# Patient Record
Sex: Female | Born: 1970
Health system: Southern US, Community
[De-identification: ages and names within clinical notes are randomized; demographics above are authoritative.]

## PROBLEM LIST (undated history)

## (undated) DIAGNOSIS — K219 Gastro-esophageal reflux disease without esophagitis: Secondary | ICD-10-CM

## (undated) HISTORY — DX: Gastro-esophageal reflux disease without esophagitis: K21.9

## (undated) HISTORY — PX: ABDOMINAL HYSTERECTOMY: SHX81

---

## 2005-02-04 ENCOUNTER — Ambulatory Visit: Payer: Self-pay | Admitting: Family Medicine

## 2006-06-30 ENCOUNTER — Inpatient Hospital Stay: Payer: Self-pay | Admitting: Obstetrics and Gynecology

## 2007-06-02 ENCOUNTER — Ambulatory Visit: Payer: Self-pay | Admitting: Family Medicine

## 2008-10-13 ENCOUNTER — Ambulatory Visit: Payer: Self-pay | Admitting: Family Medicine

## 2009-11-30 ENCOUNTER — Ambulatory Visit: Payer: Self-pay | Admitting: Family Medicine

## 2009-12-18 ENCOUNTER — Ambulatory Visit: Payer: Self-pay | Admitting: Family Medicine

## 2010-10-20 ENCOUNTER — Ambulatory Visit: Payer: Self-pay | Admitting: Internal Medicine

## 2010-12-13 ENCOUNTER — Ambulatory Visit: Payer: Self-pay | Admitting: Family Medicine

## 2010-12-25 ENCOUNTER — Ambulatory Visit: Payer: Self-pay | Admitting: Family Medicine

## 2011-12-30 ENCOUNTER — Ambulatory Visit: Payer: Self-pay | Admitting: Family Medicine

## 2012-05-28 ENCOUNTER — Ambulatory Visit: Payer: Self-pay | Admitting: Internal Medicine

## 2012-08-21 ENCOUNTER — Ambulatory Visit: Payer: Self-pay | Admitting: Family Medicine

## 2012-12-17 ENCOUNTER — Ambulatory Visit: Payer: Self-pay | Admitting: Family Medicine

## 2013-01-04 ENCOUNTER — Ambulatory Visit: Payer: Self-pay | Admitting: Unknown Physician Specialty

## 2013-02-10 ENCOUNTER — Ambulatory Visit: Payer: Self-pay | Admitting: Family Medicine

## 2013-02-17 ENCOUNTER — Ambulatory Visit: Payer: Self-pay | Admitting: Family Medicine

## 2014-03-18 ENCOUNTER — Ambulatory Visit: Payer: Self-pay | Admitting: Family Medicine

## 2014-03-21 ENCOUNTER — Ambulatory Visit: Payer: Self-pay | Admitting: Family Medicine

## 2014-05-26 ENCOUNTER — Ambulatory Visit: Payer: Self-pay | Admitting: Physician Assistant

## 2014-05-26 LAB — CBC WITH DIFFERENTIAL/PLATELET
BASOS ABS: 0 10*3/uL (ref 0.0–0.1)
Basophil %: 0.3 %
Eosinophil #: 0.1 10*3/uL (ref 0.0–0.7)
Eosinophil %: 2.1 %
HCT: 45.5 % (ref 35.0–47.0)
HGB: 15 g/dL (ref 12.0–16.0)
LYMPHS ABS: 2.3 10*3/uL (ref 1.0–3.6)
Lymphocyte %: 31.2 %
MCH: 32.1 pg (ref 26.0–34.0)
MCHC: 33 g/dL (ref 32.0–36.0)
MCV: 97 fL (ref 80–100)
Monocyte #: 0.6 x10 3/mm (ref 0.2–0.9)
Monocyte %: 8.9 %
NEUTROS ABS: 4.2 10*3/uL (ref 1.4–6.5)
Neutrophil %: 57.5 %
PLATELETS: 287 10*3/uL (ref 150–440)
RBC: 4.68 10*6/uL (ref 3.80–5.20)
RDW: 13.2 % (ref 11.5–14.5)
WBC: 7.2 10*3/uL (ref 3.6–11.0)

## 2014-05-26 LAB — BASIC METABOLIC PANEL
Anion Gap: 11 (ref 7–16)
BUN: 11 mg/dL (ref 7–18)
Calcium, Total: 9 mg/dL (ref 8.5–10.1)
Chloride: 102 mmol/L (ref 98–107)
Co2: 28 mmol/L (ref 21–32)
Creatinine: 0.78 mg/dL (ref 0.60–1.30)
EGFR (African American): >60
EGFR (Non-African Amer.): >60
Glucose: 95 mg/dL (ref 65–99)
OSMOLALITY: 280 (ref 275–301)
Potassium: 3.8 mmol/L (ref 3.5–5.1)
Sodium: 141 mmol/L (ref 136–145)

## 2014-05-26 LAB — D-DIMER(ARMC): D-Dimer: 134 ng/ml

## 2014-07-05 IMAGING — CR DG LUMBAR SPINE COMPLETE 4+V
1 series · 5 of 5 positions shown · non-contrast
Comparison: none

REASON FOR EXAM: lower back pain
COMMENTS:

[Series 1: ap · 0.17mm/px · 5 of 5 slices shown]
[im 1/5]
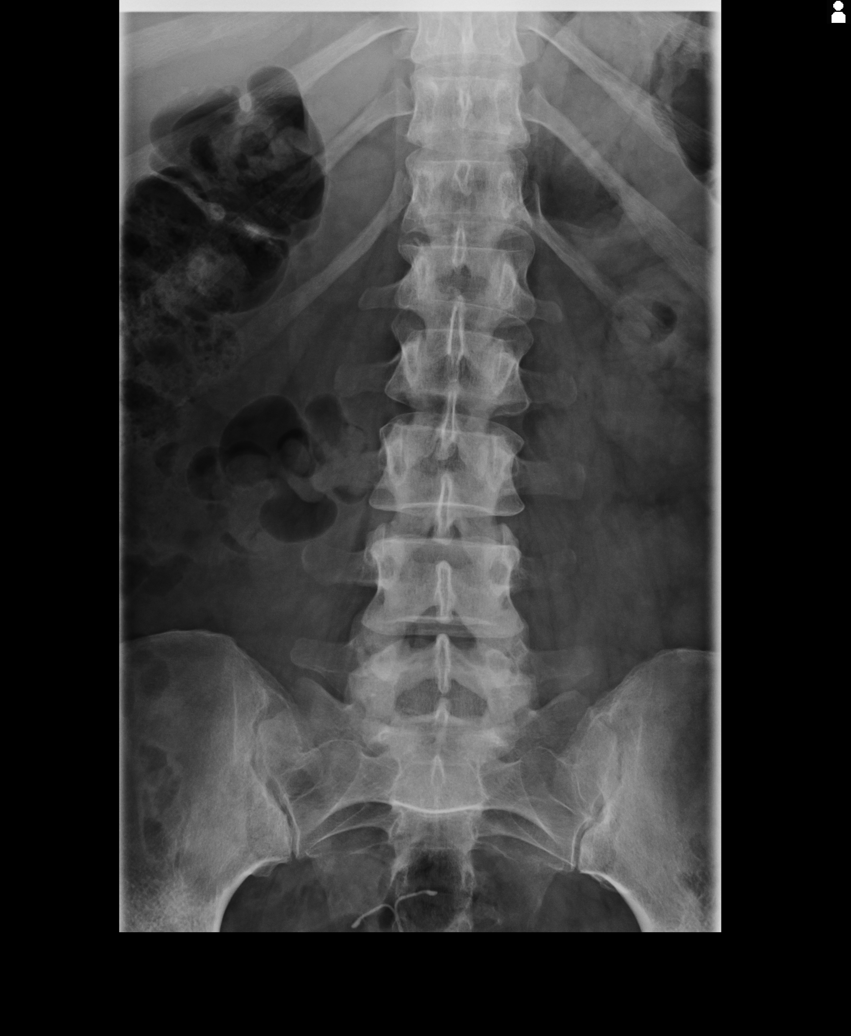
[im 2/5]
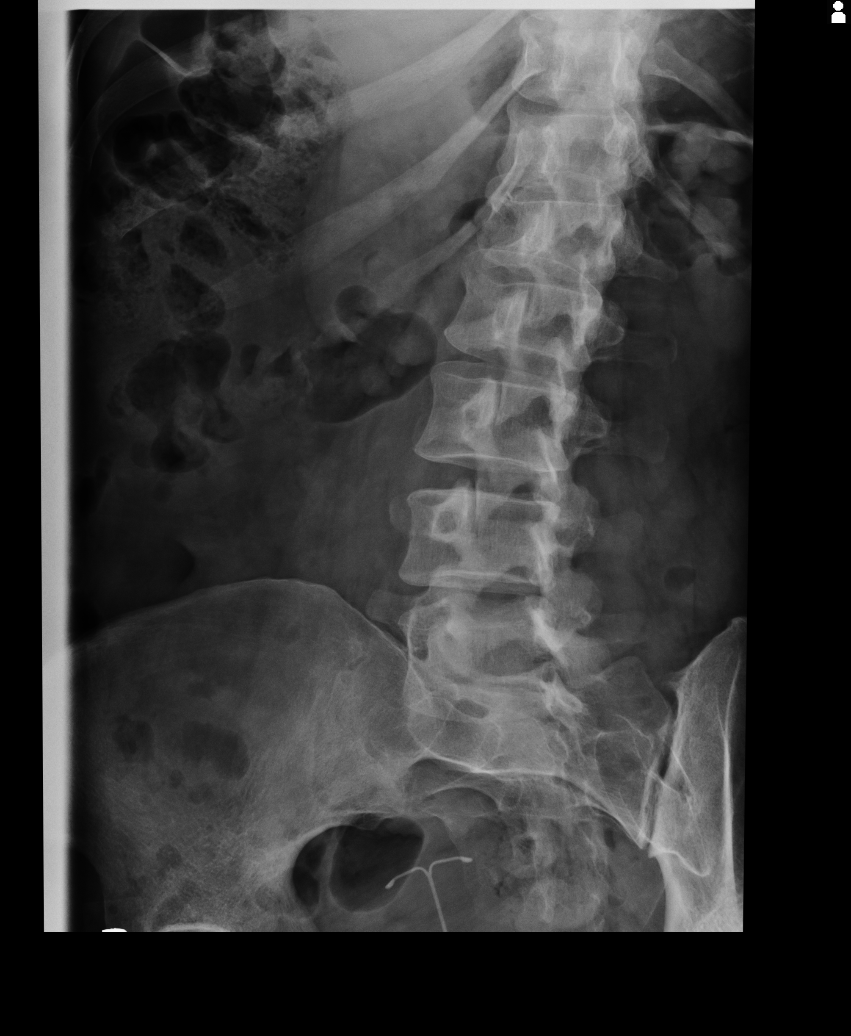
[im 3/5]
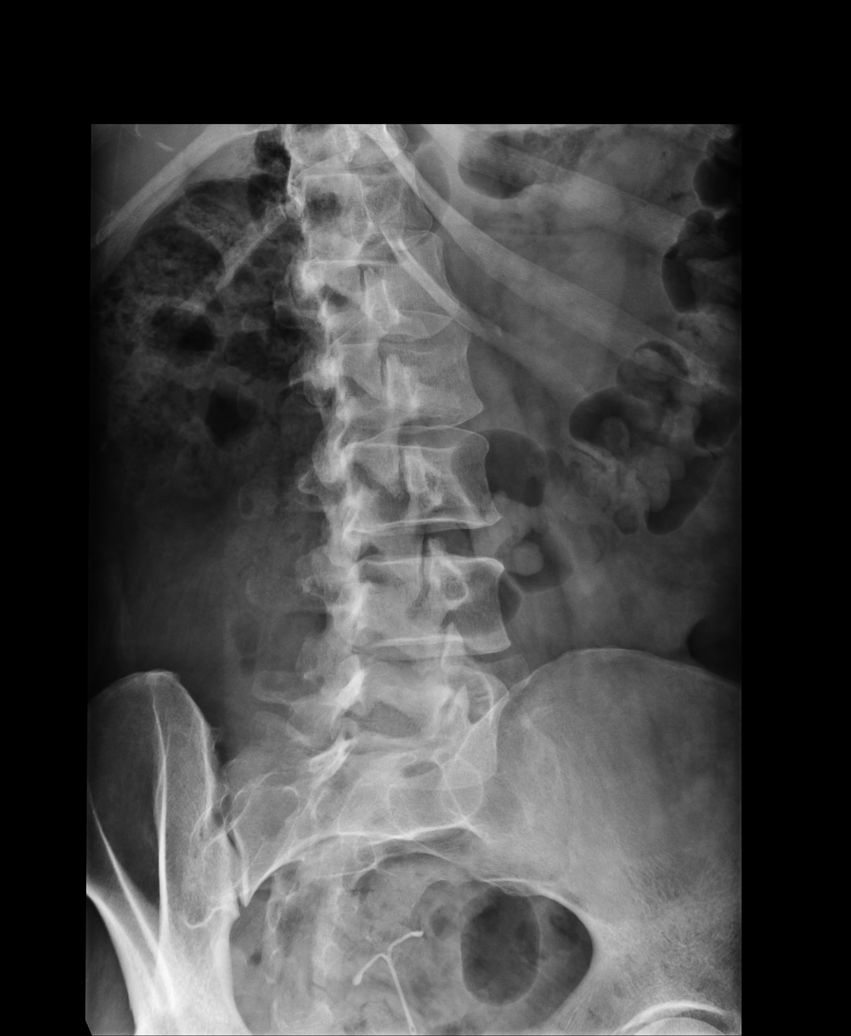
[im 4/5]
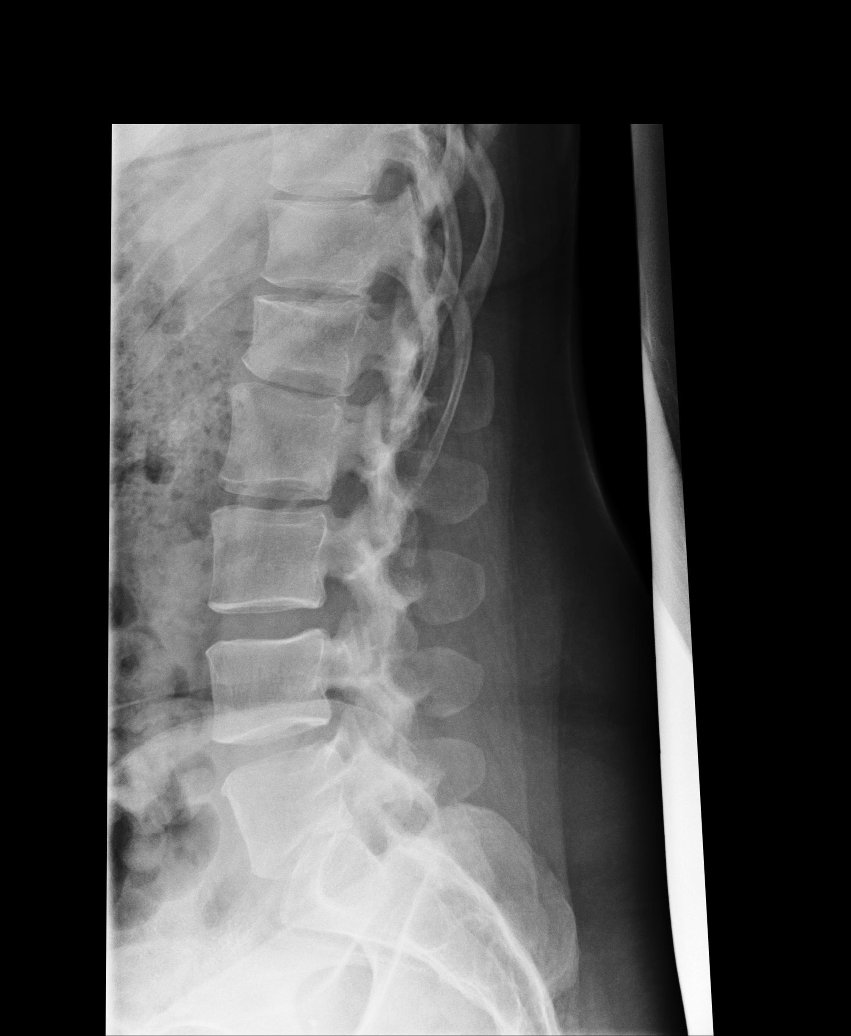
[im 5/5]
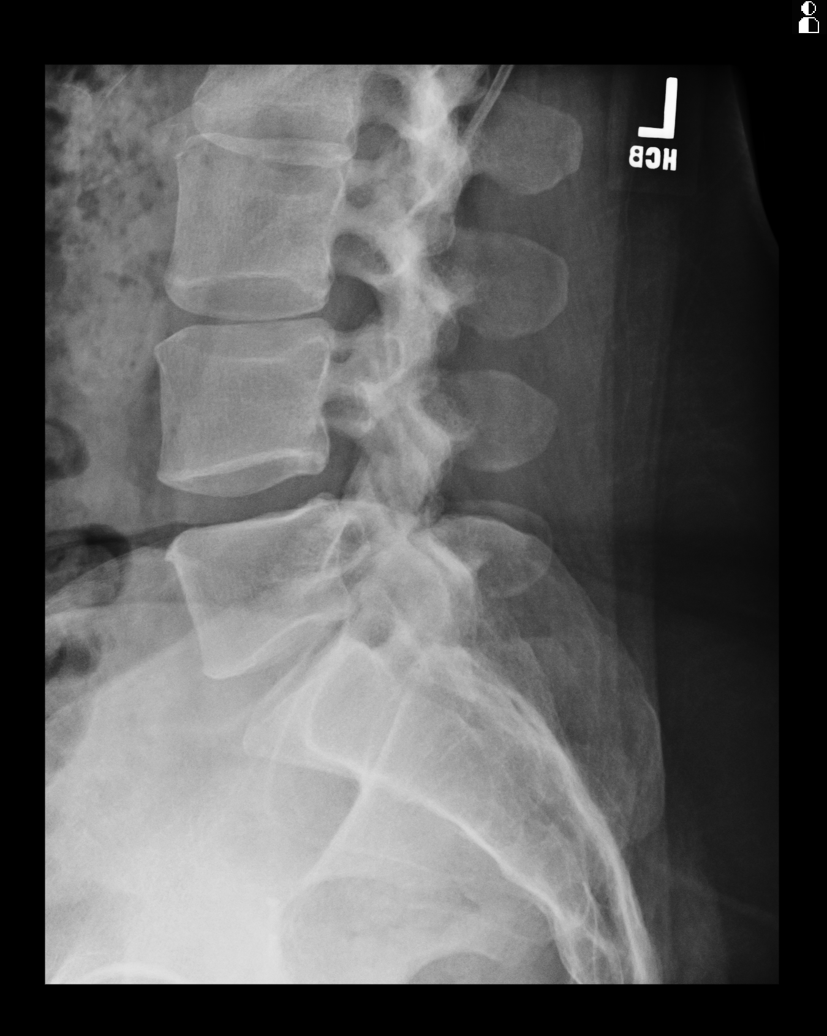

[5 of 5 positions shown; findings below may reference images not displayed]

PROCEDURE:     MDR - M[REDACTED] SPINE WITH OBLIQUES  - December 17, 2012  [DATE]

RESULT:     The lumbar vertebral bodies are preserved in height with the
exception of L1 where there is  very mild anterior wedging. Subtle
relatively acute angulation is seen along the anterior/inferior cortex of
the L1 vertebral body. The intervertebral disc space heights are reasonably
well maintained. There is no pars defect or spondylolisthesis. The pedicles
and transverse processes appear intact. An IUD is present.
IMPRESSION: There is mild wedging of the body of L1 which may be acute
or chronic. Otherwise the examination is within the limits of normal.

[REDACTED]

## 2015-03-03 ENCOUNTER — Encounter: Payer: Self-pay | Admitting: Family Medicine

## 2015-03-03 ENCOUNTER — Ambulatory Visit (INDEPENDENT_AMBULATORY_CARE_PROVIDER_SITE_OTHER): Payer: BLUE CROSS/BLUE SHIELD | Admitting: Family Medicine

## 2015-03-03 VITALS — BP 120/82 | HR 70 | Ht 70.0 in | Wt 215.0 lb

## 2015-03-03 DIAGNOSIS — E663 Overweight: Secondary | ICD-10-CM

## 2015-03-03 DIAGNOSIS — Z0000 Encounter for general adult medical examination without abnormal findings: Secondary | ICD-10-CM

## 2015-03-03 DIAGNOSIS — E0500 Thyrotoxicosis with diffuse goiter without thyrotoxic crisis or storm: Secondary | ICD-10-CM

## 2015-03-03 DIAGNOSIS — F419 Anxiety disorder, unspecified: Secondary | ICD-10-CM

## 2015-03-03 LAB — HEMOCCULT GUIAC POC 1CARD (OFFICE): FECAL OCCULT BLD: NEGATIVE

## 2015-03-03 MED ORDER — ALPRAZOLAM 0.25 MG PO TABS: 0.2500 mg | ORAL_TABLET | Freq: Two times a day (BID) | ORAL | Status: DC | PRN

## 2015-03-03 NOTE — Progress Notes (Signed)
Name: Colleen Bartlett   MRN: [id number]    DOB: 1971/06/07   Date:03/03/2015       Progress Note  Subjective  Chief Complaint  Chief Complaint  Patient presents with  . Annual Exam    pap  . Anxiety    flying to [place]- wants something for trip    Anxiety Presents for follow-up visit. Onset was more than 5 years ago. The problem has been waxing and waning. Patient reports no chest pain, compulsions, confusion, decreased concentration, depressed mood, dizziness, dry mouth, excessive worry, feeling of choking, hyperventilation, impotence, insomnia, irritability, malaise, muscle tension, nausea, nervous/anxious behavior, obsessions, palpitations, panic, restlessness, shortness of breath or suicidal ideas. Symptoms occur occasionally. The severity of symptoms is moderate. The symptoms are aggravated by specific phobias (air travel). The quality of sleep is good.   There are no known risk factors. There is no history of anemia, anxiety/panic attacks, arrhythmia, asthma, bipolar disorder, CAD, CHF, chronic lung disease, depression, fibromyalgia, hyperthyroidism or suicide attempts. Past treatments include benzodiazephines. The treatment provided significant relief. Compliance with prior treatments has been good.    No problem-specific assessment & plan notes found for this encounter.   Past Medical History  Diagnosis Date  . GERD (gastroesophageal reflux disease)     Past Surgical History  Procedure Laterality Date  . Cesarean section      Family History  Problem Relation Age of Onset  . Cancer Mother   . Diabetes Father   . Cancer Maternal Grandmother   . Cancer Maternal Grandfather   . Cancer Paternal Grandmother   . Diabetes Paternal Grandmother   . Cancer Paternal Grandfather     Social History   Social History  . Marital Status: Married    Spouse Name: N/A  . Number of Children: N/A  . Years of Education: N/A   Occupational History  . Not on file.   Social  History Main Topics  . Smoking status: Former Games developer  . Smokeless tobacco: Not on file  . Alcohol Use: 0.0 oz/week    0 Standard drinks or equivalent per week  . Drug Use: No  . Sexual Activity: Yes   Other Topics Concern  . Not on file   Social History Narrative  . No narrative on file    No Known Allergies   Review of Systems  Constitutional: Negative for fever, chills, weight loss, malaise/fatigue and irritability.  HENT: Negative for ear discharge, ear pain and sore throat.   Eyes: Negative for blurred vision.  Respiratory: Negative for cough, sputum production, shortness of breath and wheezing.   Cardiovascular: Negative for chest pain, palpitations and leg swelling.  Gastrointestinal: Negative for heartburn, nausea, abdominal pain, diarrhea, constipation, blood in stool and melena.  Genitourinary: Negative for dysuria, urgency, frequency, hematuria and impotence.  Musculoskeletal: Negative for myalgias, back pain, joint pain and neck pain.  Skin: Negative for rash.  Neurological: Negative for dizziness, tingling, sensory change, focal weakness and headaches.  Endo/Heme/Allergies: Negative for environmental allergies and polydipsia. Does not bruise/bleed easily.  Psychiatric/Behavioral: Negative for depression, suicidal ideas, confusion and decreased concentration. The patient is not nervous/anxious and does not have insomnia.      Objective  Filed Vitals:   03/03/15 0951  BP: 120/82  Pulse: 70  Height:  (1.778 m)  Weight: 215 lb (97.523 kg)    Physical Exam  Constitutional: She is well-developed, well-nourished, and in no distress. No distress.  HENT:  Head: Normocephalic and atraumatic.  Right Ear:  External ear normal.  Left Ear: External ear normal.  Nose: Nose normal.  Mouth/Throat: Oropharynx is clear and moist.  Eyes: Conjunctivae and EOM are normal. Pupils are equal, round, and reactive to light. Right eye exhibits no discharge. Left eye exhibits  no discharge.  Neck: Normal range of motion. Neck supple. No JVD present. No thyromegaly present.  Cardiovascular: Normal rate, regular rhythm, normal heart sounds and intact distal pulses.  Exam reveals no gallop and no friction rub.   No murmur heard. Pulmonary/Chest: Effort normal and breath sounds normal.  Abdominal: Soft. Bowel sounds are normal. She exhibits no mass. There is no tenderness. There is no guarding.  Musculoskeletal: Normal range of motion. She exhibits no edema.  Lymphadenopathy:    She has no cervical adenopathy.  Neurological: She is alert. She has normal reflexes.  Skin: Skin is warm and dry. She is not diaphoretic.  Psychiatric: Mood and affect normal.  Nursing note and vitals reviewed.     Assessment & Plan  Problem List Items Addressed This Visit    None    Visit Diagnoses    Annual physical exam    -  Primary    Relevant Orders    Renal Function Panel    Lipid Profile    POCT Occult Blood Stool (Completed)    MM Digital Screening    Pap IG (Image Guided)    Acute anxiety        flying anxiety/germaphobia    Relevant Medications    ALPRAZolam (XANAX) 0.25 MG tablet    Overweight with body mass index (BMI) 25.0-29.9        Graves disease        Relevant Orders    Thyroid Panel With TSH         Dr. [facility]den [facility]  03/03/2015

## 2015-03-03 NOTE — Patient Instructions (Signed)

## 2015-03-04 LAB — RENAL FUNCTION PANEL
Albumin: 4.2 g/dL (ref 3.5–5.5)
BUN/Creatinine Ratio: 18 (ref 9–23)
BUN: 11 mg/dL (ref 6–24)
CALCIUM: 8.9 mg/dL (ref 8.7–10.2)
CO2: 23 mmol/L (ref 18–29)
CREATININE: 0.62 mg/dL (ref 0.57–1.00)
Chloride: 100 mmol/L (ref 97–108)
GFR calc Af Amer: 128 mL/min/{1.73_m2} (ref >59)
GFR calc non Af Amer: 111 mL/min/{1.73_m2} (ref >59)
Glucose: 67 mg/dL (ref 65–99)
PHOSPHORUS: 3 mg/dL (ref 2.5–4.5)
Potassium: 4.3 mmol/L (ref 3.5–5.2)
Sodium: 138 mmol/L (ref 134–144)

## 2015-03-04 LAB — LIPID PANEL
CHOLESTEROL TOTAL: 200 mg/dL — AB (ref 100–199)
Chol/HDL Ratio: 4.2 ratio units (ref 0.0–4.4)
HDL: 48 mg/dL (ref >39)
LDL Calculated: 117 mg/dL — ABNORMAL HIGH (ref 0–99)
TRIGLYCERIDES: 175 mg/dL — AB (ref 0–149)
VLDL CHOLESTEROL CAL: 35 mg/dL (ref 5–40)

## 2015-03-04 LAB — THYROID PANEL WITH TSH
FREE THYROXINE INDEX: 2 (ref 1.2–4.9)
T3 Uptake Ratio: 27 % (ref 24–39)
T4, Total: 7.3 ug/dL (ref 4.5–12.0)
TSH: 1.87 u[IU]/mL (ref 0.450–4.500)

## 2015-03-10 LAB — PAP IG (IMAGE GUIDED): PAP SMEAR COMMENT: 0

## 2015-03-21 ENCOUNTER — Ambulatory Visit: Payer: BLUE CROSS/BLUE SHIELD | Attending: Family Medicine

## 2015-03-22 ENCOUNTER — Encounter: Payer: Self-pay | Admitting: Family Medicine

## 2015-03-22 ENCOUNTER — Ambulatory Visit (INDEPENDENT_AMBULATORY_CARE_PROVIDER_SITE_OTHER): Payer: BLUE CROSS/BLUE SHIELD | Admitting: Family Medicine

## 2015-03-22 VITALS — BP 120/80 | HR 68 | Ht 70.0 in | Wt 212.0 lb

## 2015-03-22 DIAGNOSIS — J219 Acute bronchiolitis, unspecified: Secondary | ICD-10-CM | POA: Diagnosis not present

## 2015-03-22 DIAGNOSIS — J0100 Acute maxillary sinusitis, unspecified: Secondary | ICD-10-CM | POA: Diagnosis not present

## 2015-03-22 MED ORDER — AMOXICILLIN 500 MG PO CAPS: 500.0000 mg | ORAL_CAPSULE | Freq: Three times a day (TID) | ORAL | Status: DC

## 2015-03-22 MED ORDER — GUAIFENESIN-CODEINE 100-10 MG/5ML PO SOLN
5.0000 mL | Freq: Three times a day (TID) | ORAL | Status: DC | PRN
Start: 2015-03-22 — End: 2015-08-22

## 2015-03-22 NOTE — Progress Notes (Signed)
Name: Colleen Bartlett   MRN: [id number]    DOB: Apr 21, 1971   Date:03/22/2015       Progress Note  Subjective  Chief Complaint  Chief Complaint  Patient presents with  . Sinusitis    productive cough- thick    Sinusitis This is a new problem. The current episode started in the past 7 days. The problem has been gradually worsening since onset. The maximum temperature recorded prior to her arrival was 100.4 - 100.9 F. The fever has been present for 3 to 4 days. Associated symptoms include chills, congestion, coughing, diaphoresis, ear pain, headaches, a hoarse voice, neck pain, sinus pressure, sneezing, a sore throat and swollen glands. Pertinent negatives include no shortness of breath. Past treatments include acetaminophen and oral decongestants. The treatment provided no relief.  Cough This is a new problem. The current episode started in the past 7 days. The problem has been gradually worsening. The cough is non-productive. Associated symptoms include chills, ear pain, a fever, headaches, heartburn, nasal congestion, postnasal drip and a sore throat. Pertinent negatives include no chest pain, ear congestion or shortness of breath. The symptoms are aggravated by pollens. Treatments tried: mucolytic agent. There is no history of asthma, bronchitis, emphysema or pneumonia.    No problem-specific assessment & plan notes found for this encounter.   Past Medical History  Diagnosis Date  . GERD (gastroesophageal reflux disease)     Past Surgical History  Procedure Laterality Date  . Cesarean section      Family History  Problem Relation Age of Onset  . Cancer Mother   . Diabetes Father   . Cancer Maternal Grandmother   . Cancer Maternal Grandfather   . Cancer Paternal Grandmother   . Diabetes Paternal Grandmother   . Cancer Paternal Grandfather     Social History   Social History  . Marital Status: Married    Spouse Name: N/A  . Number of Children: N/A  . Years of  Education: N/A   Occupational History  . Not on file.   Social History Main Topics  . Smoking status: Former Games developer  . Smokeless tobacco: Not on file  . Alcohol Use: 0.0 oz/week    0 Standard drinks or equivalent per week  . Drug Use: No  . Sexual Activity: Yes   Other Topics Concern  . Not on file   Social History Narrative    No Known Allergies   Review of Systems  Constitutional: Positive for fever, chills and diaphoresis.  HENT: Positive for congestion, ear pain, hoarse voice, postnasal drip, sinus pressure, sneezing and sore throat.   Respiratory: Positive for cough. Negative for shortness of breath.   Cardiovascular: Negative for chest pain.  Gastrointestinal: Positive for heartburn.       Intermitant zantac  Musculoskeletal: Positive for neck pain.  Neurological: Positive for headaches.     Objective  Filed Vitals:   03/22/15 1008  BP: 120/80  Pulse: 68  Height:  (1.778 m)  Weight: 212 lb (96.163 kg)    Physical Exam  Constitutional: She is well-developed, well-nourished, and in no distress. No distress.  HENT:  Head: Normocephalic and atraumatic.  Right Ear: External ear normal.  Left Ear: External ear normal.  Nose: Nose normal.  Mouth/Throat: Oropharynx is clear and moist.  Eyes: Conjunctivae and EOM are normal. Pupils are equal, round, and reactive to light. Right eye exhibits no discharge. Left eye exhibits no discharge.  Neck: Normal range of motion. Neck supple. No JVD  present. No thyromegaly present.  Cardiovascular: Normal rate, regular rhythm, normal heart sounds and intact distal pulses.  Exam reveals no gallop and no friction rub.   No murmur heard. Pulmonary/Chest: Effort normal and breath sounds normal.  Abdominal: Soft. Bowel sounds are normal. She exhibits no mass. There is no tenderness. There is no guarding.  Musculoskeletal: Normal range of motion. She exhibits no edema.  Lymphadenopathy:    She has no cervical adenopathy.   Neurological: She is alert. She has normal reflexes.  Skin: Skin is warm and dry. She is not diaphoretic.  Psychiatric: Mood and affect normal.      Assessment & Plan  Problem List Items Addressed This Visit    None    Visit Diagnoses    Acute maxillary sinusitis, recurrence not specified    -  Primary    Relevant Medications    amoxicillin (AMOXIL) 500 MG capsule    guaiFENesin-codeine 100-10 MG/5ML syrup    Bronchiolitis        Relevant Medications    amoxicillin (AMOXIL) 500 MG capsule    guaiFENesin-codeine 100-10 MG/5ML syrup         Dr. [facility]  03/22/2015

## 2015-08-22 ENCOUNTER — Ambulatory Visit (INDEPENDENT_AMBULATORY_CARE_PROVIDER_SITE_OTHER): Payer: BLUE CROSS/BLUE SHIELD | Admitting: Family Medicine

## 2015-08-22 ENCOUNTER — Encounter: Payer: Self-pay | Admitting: Family Medicine

## 2015-08-22 VITALS — BP 110/80 | HR 78 | Ht 70.0 in | Wt 218.0 lb

## 2015-08-22 DIAGNOSIS — J0100 Acute maxillary sinusitis, unspecified: Secondary | ICD-10-CM | POA: Diagnosis not present

## 2015-08-22 MED ORDER — AMOXICILLIN-POT CLAVULANATE 875-125 MG PO TABS: 1.0000 | ORAL_TABLET | Freq: Two times a day (BID) | ORAL | Status: DC

## 2015-08-22 MED ORDER — FLUTICASONE PROPIONATE 50 MCG/ACT NA SUSP: 2.0000 | Freq: Every day | NASAL | Status: DC

## 2015-08-22 NOTE — Patient Instructions (Signed)
Sinus Headache A sinus headache occurs when the paranasal sinuses become clogged or swollen. Paranasal sinuses are air pockets within the bones of the face. Sinus headaches can range from mild to severe. CAUSES A sinus headache can result from various conditions that affect the sinuses, such as:  Colds.  Sinus infections.  Allergies. SYMPTOMS The main symptom of this condition is a headache that may feel like pain or pressure in the face, forehead, ears, or upper teeth. People who have a sinus headache often have other symptoms, such as:  Congested or runny nose.  Fever.  Inability to smell. Weather changes can make symptoms worse. DIAGNOSIS This condition may be diagnosed based on:  A physical exam and medical history.  Imaging tests, such as a CT scan and MRI, to check for problems with the sinuses.  A specialist may look into the sinuses with a tool that has a camera (endoscopy). TREATMENT Treatment for this condition depends on the cause.  Sinus pain that is caused by a sinus infection may be treated with antibiotic medicine.  Sinus pain that is caused by allergies may be helped by allergy medicines (antihistamines) and medicated nasal sprays.  Sinus pain that is caused by congestion may be helped by flushing the nose and sinuses with saline solution. HOME CARE INSTRUCTIONS  Take medicines only as directed by your health care provider.  If you were prescribed an antibiotic medicine, finish all of it even if you start to feel better.  If you have congestion, use a nasal spray to help reduce pressure.  If directed, apply a warm, moist washcloth to your face to help relieve pain. SEEK MEDICAL CARE IF:  You have headaches more than one time each week.  You have sensitivity to light or sound.  You have a fever.  You feel sick to your stomach (nauseous) or you throw up (vomit).  Your headaches do not get better with treatment. Many people think that they have a  sinus headache when they actually have migraines or tension headaches. SEEK IMMEDIATE MEDICAL CARE IF:  You have vision problems.  You have sudden, severe pain in your face or head.  You have a seizure.  You are confused.  You have a stiff neck.   This information is not intended to replace advice given to you by your health care provider. Make sure you discuss any questions you have with your health care provider.   Document Released: 07/18/2004 Document Revised: 10/25/2014 Document Reviewed: 06/06/2014 Elsevier Interactive Patient Education 2016 [employer]  

## 2015-08-22 NOTE — Progress Notes (Signed)
Name: Colleen Bartlett   MRN: [id number]    DOB: April 08, 1971   Date:08/22/2015       Progress Note  Subjective  Chief Complaint  Chief Complaint  Patient presents with  . Headache    started x 4 days ago after ear popped- hurts on L) side into neck    Headache  This is a new problem. The current episode started in the past 7 days. The problem occurs constantly. The problem has been waxing and waning. The pain is located in the left unilateral and retro-orbital region. The pain radiates to the face. The quality of the pain is described as aching. The pain is at a severity of 8/10. The pain is moderate. Associated symptoms include neck pain, sinus pressure and tinnitus. Pertinent negatives include no abdominal pain, back pain, blurred vision, coughing, dizziness, ear pain, eye pain, eye redness, eye watering, fever, hearing loss, insomnia, nausea, numbness, sore throat, tingling, visual change, weakness or weight loss. The symptoms are aggravated by noise. She has tried acetaminophen and NSAIDs for the symptoms. The treatment provided mild relief. Her past medical history is significant for migraine headaches.    No problem-specific assessment & plan notes found for this encounter.   Past Medical History  Diagnosis Date  . GERD (gastroesophageal reflux disease)     Past Surgical History  Procedure Laterality Date  . Cesarean section      Family History  Problem Relation Age of Onset  . Cancer Mother   . Diabetes Father   . Cancer Maternal Grandmother   . Cancer Maternal Grandfather   . Cancer Paternal Grandmother   . Diabetes Paternal Grandmother   . Cancer Paternal Grandfather     Social History   Social History  . Marital Status: Married    Spouse Name: N/A  . Number of Children: N/A  . Years of Education: N/A   Occupational History  . Not on file.   Social History Main Topics  . Smoking status: Former Games developer  . Smokeless tobacco: Not on file  . Alcohol Use: 0.0  oz/week    0 Standard drinks or equivalent per week  . Drug Use: No  . Sexual Activity: Yes   Other Topics Concern  . Not on file   Social History Narrative    No Known Allergies   Review of Systems  Constitutional: Negative for fever, chills, weight loss and malaise/fatigue.  HENT: Positive for sinus pressure and tinnitus. Negative for ear discharge, ear pain, hearing loss and sore throat.   Eyes: Negative for blurred vision, pain and redness.  Respiratory: Negative for cough, sputum production, shortness of breath and wheezing.   Cardiovascular: Negative for chest pain, palpitations and leg swelling.  Gastrointestinal: Negative for heartburn, nausea, abdominal pain, diarrhea, constipation, blood in stool and melena.  Genitourinary: Negative for dysuria, urgency, frequency and hematuria.  Musculoskeletal: Positive for neck pain. Negative for myalgias, back pain and joint pain.  Skin: Negative for rash.  Neurological: Positive for headaches. Negative for dizziness, tingling, sensory change, focal weakness, weakness and numbness.  Endo/Heme/Allergies: Negative for environmental allergies and polydipsia. Does not bruise/bleed easily.  Psychiatric/Behavioral: Negative for depression and suicidal ideas. The patient is not nervous/anxious and does not have insomnia.      Objective  Filed Vitals:   08/22/15 1359  BP: 110/80  Pulse: 78  Height:  (1.778 m)  Weight: 218 lb (98.884 kg)    Physical Exam  Constitutional: She is oriented to person, place,  and time and well-developed, well-nourished, and in no distress. No distress.  HENT:  Head: Normocephalic and atraumatic.  Right Ear: Hearing, tympanic membrane, external ear and ear canal normal. No tenderness. No middle ear effusion.  Left Ear: Hearing, tympanic membrane, external ear and ear canal normal. No tenderness.  No middle ear effusion.  Nose: Nose normal.  Mouth/Throat: Oropharynx is clear and moist.  Eyes:  Conjunctivae and EOM are normal. Pupils are equal, round, and reactive to light. Right eye exhibits no discharge. Left eye exhibits no discharge.  Neck: Normal range of motion. Neck supple. No JVD present. No thyromegaly present.  Cardiovascular: Normal rate, regular rhythm, normal heart sounds and intact distal pulses.  Exam reveals no gallop and no friction rub.   No murmur heard. Pulmonary/Chest: Effort normal and breath sounds normal.  Abdominal: Soft. Bowel sounds are normal. She exhibits no mass. There is no tenderness. There is no guarding.  Musculoskeletal: Normal range of motion. She exhibits no edema.  Lymphadenopathy:    She has no cervical adenopathy.  Neurological: She is alert and oriented to person, place, and time. She has normal motor skills, normal sensation, normal strength, normal reflexes and intact cranial nerves. No cranial nerve deficit. Gait normal.  Skin: Skin is warm and dry. She is not diaphoretic.  Psychiatric: Mood and affect normal.      Assessment & Plan  Problem List Items Addressed This Visit    None    Visit Diagnoses    Acute maxillary sinusitis, recurrence not specified    -  Primary    sudsfed/30mg     Relevant Medications    amoxicillin-clavulanate (AUGMENTIN) 875-125 MG tablet    fluticasone (FLONASE) 50 MCG/ACT nasal spray         Dr. [facility]  08/22/2015

## 2015-12-12 ENCOUNTER — Encounter: Payer: Self-pay | Admitting: Family Medicine

## 2015-12-12 ENCOUNTER — Ambulatory Visit (INDEPENDENT_AMBULATORY_CARE_PROVIDER_SITE_OTHER): Payer: BLUE CROSS/BLUE SHIELD | Admitting: Family Medicine

## 2015-12-12 VITALS — BP 120/80 | HR 80 | Ht 69.0 in | Wt 221.0 lb

## 2015-12-12 DIAGNOSIS — K645 Perianal venous thrombosis: Secondary | ICD-10-CM | POA: Diagnosis not present

## 2015-12-12 IMAGING — CR DG CHEST 2V
2 series · 2 of 2 positions shown · non-contrast
Comparison: Lumbar spine films of 12/17/2012

CLINICAL DATA: Chest pain

EXAM:
CHEST  2 VIEW

[chest pa]
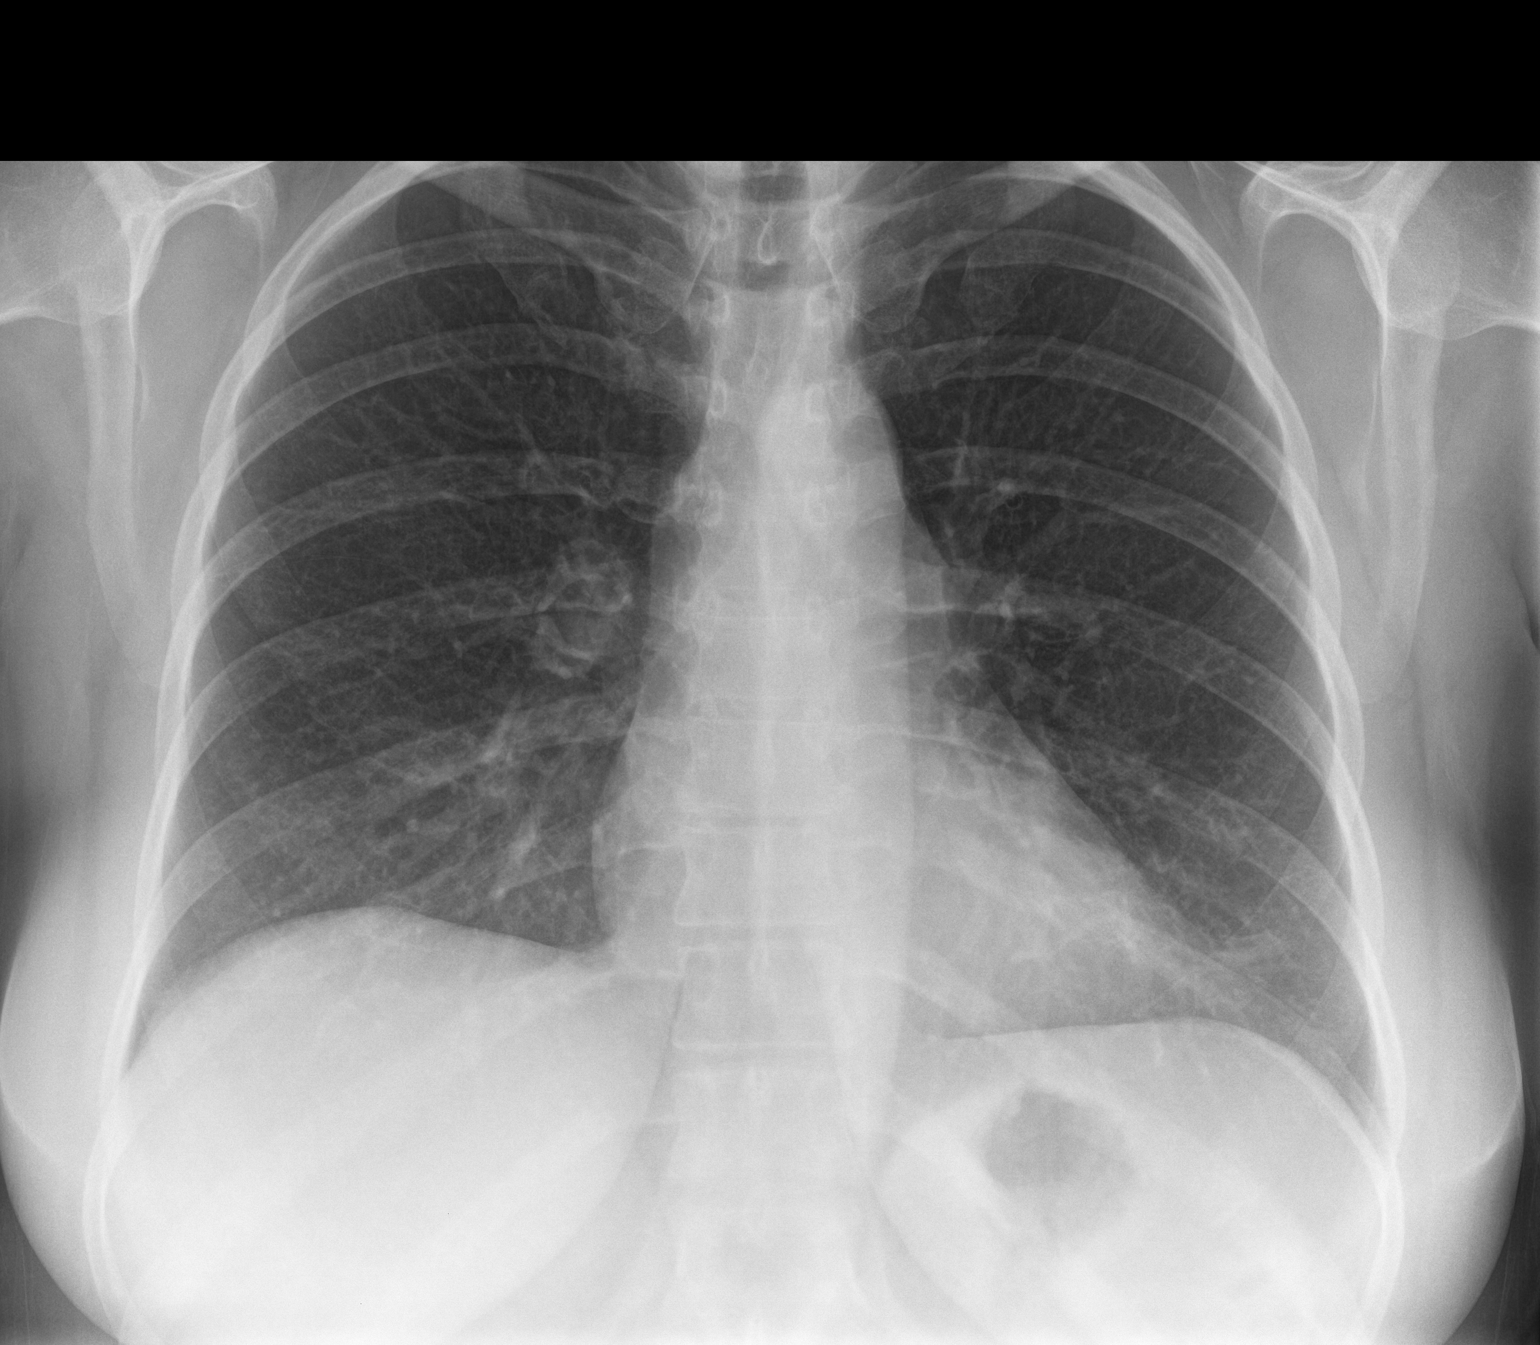

[chest lat]
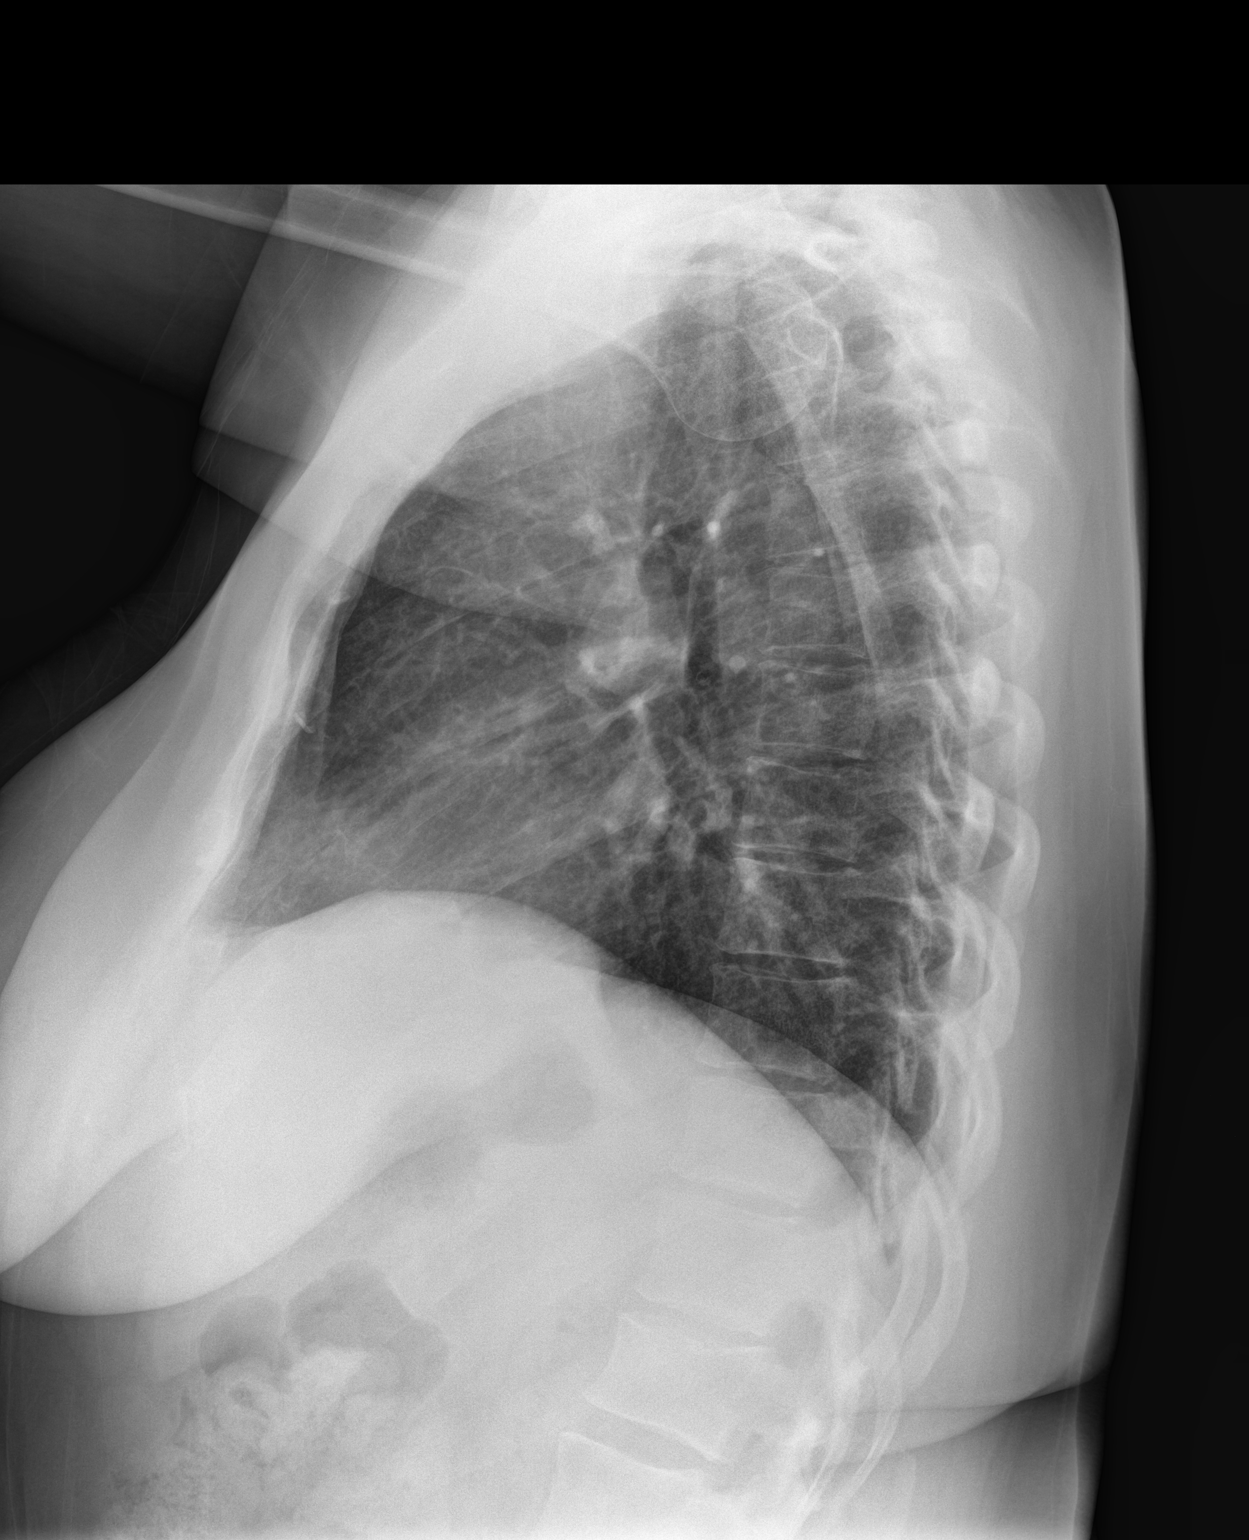

[2 of 2 positions shown; findings below may reference images not displayed]

FINDINGS: No active infiltrate or effusion is seen, with mild linear
atelectasis or scarring at the left lung base. Mediastinal and hilar
contours are unremarkable. The heart is within normal limits in
size. No acute bony abnormality is seen. A partially compressed L1
vertebral body is unchanged compared to lumbar spine films from
12/17/2012.
IMPRESSION: No active lung disease. Mild linear atelectasis or scarring at the
left lung base. Stable anterior wedging of L1.

## 2015-12-12 MED ORDER — HYDROCORTISONE 2.5 % RE CREA: 1.0000 "application " | TOPICAL_CREAM | Freq: Two times a day (BID) | RECTAL | Status: DC

## 2015-12-12 NOTE — Progress Notes (Signed)
Name: Colleen Bartlett   MRN: [id number]    DOB: Dec 26, 1970   Date:12/12/2015       Progress Note  Subjective  Chief Complaint  Chief Complaint  Patient presents with  . Hemorrhoids    started week and half ago- external and bleeding    HPI Comments: Patient presents for a suspected bleeding hemarrhoid.   No problem-specific assessment & plan notes found for this encounter.   Past Medical History  Diagnosis Date  . GERD (gastroesophageal reflux disease)     Past Surgical History  Procedure Laterality Date  . Cesarean section      Family History  Problem Relation Age of Onset  . Cancer Mother   . Diabetes Father   . Cancer Maternal Grandmother   . Cancer Maternal Grandfather   . Cancer Paternal Grandmother   . Diabetes Paternal Grandmother   . Cancer Paternal Grandfather     Social History   Social History  . Marital Status: Married    Spouse Name: N/A  . Number of Children: N/A  . Years of Education: N/A   Occupational History  . Not on file.   Social History Main Topics  . Smoking status: Former Games developer  . Smokeless tobacco: Not on file  . Alcohol Use: 0.0 oz/week    0 Standard drinks or equivalent per week  . Drug Use: No  . Sexual Activity: Yes   Other Topics Concern  . Not on file   Social History Narrative    No Known Allergies   Review of Systems  Constitutional: Negative for fever, chills, weight loss and malaise/fatigue.  HENT: Negative for ear discharge, ear pain and sore throat.   Eyes: Negative for blurred vision.  Respiratory: Negative for cough, sputum production, shortness of breath and wheezing.   Cardiovascular: Negative for chest pain, palpitations and leg swelling.  Gastrointestinal: Negative for heartburn, nausea, abdominal pain, diarrhea, constipation, blood in stool and melena.  Genitourinary: Negative for dysuria, urgency, frequency and hematuria.  Musculoskeletal: Negative for myalgias, back pain, joint pain and neck  pain.  Skin: Negative for rash.  Neurological: Negative for dizziness, tingling, sensory change, focal weakness and headaches.  Endo/Heme/Allergies: Negative for environmental allergies and polydipsia. Does not bruise/bleed easily.  Psychiatric/Behavioral: Negative for depression and suicidal ideas. The patient is not nervous/anxious and does not have insomnia.      Objective  Filed Vitals:   12/12/15 0809  BP: 120/80  Pulse: 80  Height:  (1.753 m)  Weight: 221 lb (100.245 kg)    Physical Exam  Constitutional: She is well-developed, well-nourished, and in no distress. No distress.  HENT:  Head: Normocephalic and atraumatic.  Right Ear: External ear normal.  Left Ear: External ear normal.  Nose: Nose normal.  Mouth/Throat: Oropharynx is clear and moist.  Eyes: Conjunctivae and EOM are normal. Pupils are equal, round, and reactive to light. Right eye exhibits no discharge. Left eye exhibits no discharge.  Neck: Normal range of motion. Neck supple. No JVD present. No thyromegaly present.  Cardiovascular: Normal rate, regular rhythm, normal heart sounds and intact distal pulses.  Exam reveals no gallop and no friction rub.   No murmur heard. Pulmonary/Chest: Effort normal and breath sounds normal. She has no wheezes. She has no rales.  Abdominal: Soft. Bowel sounds are normal. She exhibits no mass. There is no tenderness. There is no guarding.  Genitourinary: Rectal exam shows external hemorrhoid. Rectal exam shows no fissure, no laceration, no mass and no tenderness.  Guaiac positive stool.  External hemorrhoid. With opening and no residual clot. Nontender. Previously painful with clot and blood noted few days ago/ no pain now  Musculoskeletal: Normal range of motion. She exhibits no edema.  Lymphadenopathy:    She has no cervical adenopathy.  Neurological: She is alert.  Skin: Skin is warm and dry. She is not diaphoretic.  Psychiatric: Mood and affect normal.  Nursing note  and vitals reviewed.     Assessment & Plan  Problem List Items Addressed This Visit    None    Visit Diagnoses    Hemorrhoids, external, thrombosed    -  Primary    Relevant Medications    hydrocortisone (ANUSOL-HC) 2.5 % rectal cream         Dr. Hayden [facility]  12/12/2015

## 2015-12-12 NOTE — Patient Instructions (Addendum)
How to Take a Sitz Bath A sitz bath is a warm water bath that is taken while you are sitting down. The water should only come up to your hips and should cover your buttocks. Your health care provider may recommend a sitz bath to help you:   Clean the lower part of your body, including your genital area.  With itching.  With pain.  With sore muscles or muscles that tighten or spasm. HOW TO TAKE A SITZ BATH Take 3-4 sitz baths per day or as told by your health care provider. 1. Partially fill a bathtub with warm water. You will only need the water to be deep enough to cover your hips and buttocks when you are sitting in it. 2. If your health care provider told you to put medicine in the water, follow the directions exactly. 3. Sit in the water and open the tub drain a little. 4. Turn on the warm water again to keep the tub at the correct level. Keep the water running constantly. 5. Soak in the water for 15-20 minutes or as told by your health care provider. 6. After the sitz bath, pat the affected area dry first. Do not rub it. 7. Be careful when you stand up after the sitz bath because you may feel dizzy. SEEK MEDICAL CARE IF:  Your symptoms get worse. Do not continue with sitz baths if your symptoms get worse.  You have new symptoms. Do not continue with sitz baths until you talk with your health care provider.   This information is not intended to replace advice given to you by your health care provider. Make sure you discuss any questions you have with your health care provider.   Document Released: 03/02/2004 Document Revised: 10/25/2014 Document Reviewed: 06/08/2014 Elsevier Interactive Patient Education 2016 Elsevier Inc. Witch Hazel topical solution wipes What is this medicine? WITCH HAZEL [facility] hey zuhl) is a botanical astringent from the plant [facility]. The wipes and pads are used to relieve itching, burning, and irritation caused by hemorrhoids or bowel movements.  They may also be used to clean the outer vaginal area after childbirth or the rectal area following rectal surgery. This medicine may be used for other purposes; ask your health care provider or pharmacist if you have questions. What should I tell my health care provider before I take this medicine? They need to know if you have any of these conditions: -bleeding in the treated area -an unusual or allergic reaction to witch hazel, other medicines, foods, dyes, or preservatives How should I use this medicine? This medicine is for external use only. Follow the directions on the package label or the advice of your doctor or health care professional. Do not use your medicine more often than directed. Talk to your pediatrician regarding the use of this medicine in children. While this drug may be used for children as young as 12 years for selected conditions, precautions do apply. Overdosage: If you think you have taken too much of this medicine contact a poison control center or emergency room at once. NOTE: This medicine is only for you. Do not share this medicine with others. What if I miss a dose? This does not apply; the pads or wipes may be used as needed, up to 6 times per day. What may interact with this medicine? Interactions are not expected. Do not use any other skin products on the same area of skin without asking your doctor or health care professional. This list  may not describe all possible interactions. Give your health care provider a list of all the medicines, herbs, non-prescription drugs, or dietary supplements you use. Also tell them if you smoke, drink alcohol, or use illegal drugs. Some items may interact with your medicine. What should I watch for while using this medicine? Tell your doctor or healthcare professional if your symptoms do not start to get better or if they get worse. Many pads and wipes are safe for septic and sewer system disposal. Check specific product  label. What side effects may I notice from receiving this medicine? Side effects that you should report to your doctor or health care professional as soon as possible: -allergic reactions like skin rash, itching or hives, swelling of the face, lips, or tongue Side effects that usually do not require medical attention (Report these to your doctor or health care professional if they continue or are bothersome.): -mild skin dryness or irritation This list may not describe all possible side effects. Call your doctor for medical advice about side effects. You may report side effects to FDA at 1-800-FDA-1088. Where should I keep my medicine? Keep out of the reach of children. Store at room temperature between 15 and 30 degrees C (59 and 86 degrees F). Throw away any unused medicine after the expiration date. NOTE: This sheet is a summary. It may not cover all possible information. If you have questions about this medicine, talk to your doctor, pharmacist, or health care provider.    2016, Elsevier/Gold Standard. (2010-02-08 13:23:17) Hemorrhoids Hemorrhoids are swollen veins around the rectum or anus. There are two types of hemorrhoids:   Internal hemorrhoids. These occur in the veins just inside the rectum. They may poke through to the outside and become irritated and painful.  External hemorrhoids. These occur in the veins outside the anus and can be felt as a painful swelling or hard lump near the anus. CAUSES 8. Pregnancy.  9. Obesity.  10. Constipation or diarrhea.  11. Straining to have a bowel movement.  12. Sitting for long periods on the toilet. 13. Heavy lifting or other activity that caused you to strain. 14. Anal intercourse. SYMPTOMS   Pain.   Anal itching or irritation.   Rectal bleeding.   Fecal leakage.   Anal swelling.   One or more lumps around the anus.  DIAGNOSIS  Your caregiver may be able to diagnose hemorrhoids by visual examination. Other  examinations or tests that may be performed include:   Examination of the rectal area with a gloved hand (digital rectal exam).   Examination of anal canal using a small tube (scope).   A blood test if you have lost a significant amount of blood.  A test to look inside the colon (sigmoidoscopy or colonoscopy). TREATMENT Most hemorrhoids can be treated at home. However, if symptoms do not seem to be getting better or if you have a lot of rectal bleeding, your caregiver may perform a procedure to help make the hemorrhoids get smaller or remove them completely. Possible treatments include:   Placing a rubber band at the base of the hemorrhoid to cut off the circulation (rubber band ligation).   Injecting a chemical to shrink the hemorrhoid (sclerotherapy).   Using a tool to burn the hemorrhoid (infrared light therapy).   Surgically removing the hemorrhoid (hemorrhoidectomy).   Stapling the hemorrhoid to block blood flow to the tissue (hemorrhoid stapling).  HOME CARE INSTRUCTIONS   Eat foods with fiber, such as whole grains, beans,  nuts, fruits, and vegetables. Ask your doctor about taking products with added fiber in them (fibersupplements).  Increase fluid intake. Drink enough water and fluids to keep your urine clear or pale yellow.   Exercise regularly.   Go to the bathroom when you have the urge to have a bowel movement. Do not wait.   Avoid straining to have bowel movements.   Keep the anal area dry and clean. Use wet toilet paper or moist towelettes after a bowel movement.   Medicated creams and suppositories may be used or applied as directed.   Only take over-the-counter or prescription medicines as directed by your caregiver.   Take warm sitz baths for 15-20 minutes, 3-4 times a day to ease pain and discomfort.   Place ice packs on the hemorrhoids if they are tender and swollen. Using ice packs between sitz baths may be helpful.   Put ice in a  plastic bag.   Place a towel between your skin and the bag.   Leave the ice on for 15-20 minutes, 3-4 times a day.   Do not use a donut-shaped pillow or sit on the toilet for long periods. This increases blood pooling and pain.  SEEK MEDICAL CARE IF:  You have increasing pain and swelling that is not controlled by treatment or medicine.  You have uncontrolled bleeding.  You have difficulty or you are unable to have a bowel movement.  You have pain or inflammation outside the area of the hemorrhoids. MAKE SURE YOU:  Understand these instructions.  Will watch your condition.  Will get help right away if you are not doing well or get worse.   This information is not intended to replace advice given to you by your health care provider. Make sure you discuss any questions you have with your health care provider.   Document Released: 06/07/2000 Document Revised: 05/27/2012 Document Reviewed: 04/14/2012 Elsevier Interactive Patient Education [employer]

## 2015-12-14 ENCOUNTER — Other Ambulatory Visit: Payer: Self-pay

## 2015-12-14 DIAGNOSIS — K649 Unspecified hemorrhoids: Secondary | ICD-10-CM

## 2015-12-15 ENCOUNTER — Ambulatory Visit: Payer: BLUE CROSS/BLUE SHIELD | Admitting: Surgery

## 2015-12-18 ENCOUNTER — Other Ambulatory Visit: Payer: Self-pay

## 2015-12-18 DIAGNOSIS — K649 Unspecified hemorrhoids: Secondary | ICD-10-CM

## 2016-01-29 DIAGNOSIS — K625 Hemorrhage of anus and rectum: Secondary | ICD-10-CM | POA: Diagnosis not present

## 2016-01-29 DIAGNOSIS — K644 Residual hemorrhoidal skin tags: Secondary | ICD-10-CM | POA: Diagnosis not present

## 2016-03-04 ENCOUNTER — Encounter: Payer: BLUE CROSS/BLUE SHIELD | Admitting: Family Medicine

## 2016-03-05 ENCOUNTER — Encounter: Payer: BLUE CROSS/BLUE SHIELD | Admitting: Family Medicine

## 2016-03-18 ENCOUNTER — Ambulatory Visit (INDEPENDENT_AMBULATORY_CARE_PROVIDER_SITE_OTHER): Payer: BLUE CROSS/BLUE SHIELD | Admitting: Family Medicine

## 2016-03-18 ENCOUNTER — Encounter: Payer: Self-pay | Admitting: Family Medicine

## 2016-03-18 VITALS — BP 120/80 | HR 80 | Ht 69.0 in | Wt 222.0 lb

## 2016-03-18 DIAGNOSIS — F410 Panic disorder [episodic paroxysmal anxiety] without agoraphobia: Secondary | ICD-10-CM

## 2016-03-18 DIAGNOSIS — Z0000 Encounter for general adult medical examination without abnormal findings: Secondary | ICD-10-CM

## 2016-03-18 DIAGNOSIS — K219 Gastro-esophageal reflux disease without esophagitis: Secondary | ICD-10-CM | POA: Insufficient documentation

## 2016-03-18 DIAGNOSIS — Z1239 Encounter for other screening for malignant neoplasm of breast: Secondary | ICD-10-CM | POA: Diagnosis not present

## 2016-03-18 DIAGNOSIS — Z803 Family history of malignant neoplasm of breast: Secondary | ICD-10-CM

## 2016-03-18 MED ORDER — RANITIDINE HCL 150 MG PO CAPS: 150.0000 mg | ORAL_CAPSULE | ORAL | 5 refills | Status: DC

## 2016-03-18 MED ORDER — ALPRAZOLAM 0.25 MG PO TABS: 0.2500 mg | ORAL_TABLET | Freq: Every day | ORAL | 0 refills | Status: DC | PRN

## 2016-03-18 NOTE — Progress Notes (Signed)
Name: Colleen Bartlett   MRN: [id number]    DOB: [birth year]   Date:03/18/2016       Progress Note  Subjective  Chief Complaint  Chief Complaint  Patient presents with  . Annual Exam    no pap/ needs mammo [facility]  . Gastroesophageal Reflux  . Anxiety    traveling at end of Oct- wants xanax for flight    Patient presents for annual physical exam.    Gastroesophageal Reflux  She complains of heartburn. She reports no abdominal pain, no belching, no chest pain, no choking, no coughing, no dysphagia, no early satiety, no globus sensation, no hoarse voice, no nausea, no sore throat, no stridor or no wheezing. This is a chronic problem. The current episode started more than 1 year ago. The problem occurs occasionally. The problem has been waxing and waning. The heartburn duration is several minutes. The heartburn is of mild intensity. The heartburn does not wake her from sleep. The symptoms are aggravated by certain foods. Pertinent negatives include no anemia, fatigue, melena, muscle weakness, orthopnea or weight loss. There are no known risk factors. She has tried a histamine-2 antagonist for the symptoms. The treatment provided mild relief.  Anxiety  Presents for follow-up visit. Symptoms include nervous/anxious behavior and panic. Patient reports no chest pain, compulsions, confusion, decreased concentration, depressed mood, dizziness, dry mouth, excessive worry, feeling of choking, hyperventilation, impotence, insomnia, irritability, malaise, muscle tension, nausea, obsessions, palpitations, restlessness, shortness of breath or suicidal ideas. Symptoms occur occasionally (episodic travel). The severity of symptoms is moderate. The quality of sleep is good.      No problem-specific Assessment & Plan notes found for this encounter.   Past Medical History:  Diagnosis Date  . GERD (gastroesophageal reflux disease)     Past Surgical History:  Procedure Laterality Date  . CESAREAN  SECTION      Family History  Problem Relation Age of Onset  . Cancer Mother   . Diabetes Father   . Cancer Maternal Grandmother   . Cancer Maternal Grandfather   . Cancer Paternal Grandmother   . Diabetes Paternal Grandmother   . Cancer Paternal Grandfather     Social History   Social History  . Marital status: Married    Spouse name: N/A  . Number of children: N/A  . Years of education: N/A   Occupational History  . Not on file.   Social History Main Topics  . Smoking status: Former Games developer  . Smokeless tobacco: Not on file  . Alcohol use 0.0 oz/week  . Drug use: No  . Sexual activity: Yes   Other Topics Concern  . Not on file   Social History Narrative  . No narrative on file    No Known Allergies   Review of Systems  Constitutional: Negative for chills, fatigue, fever, irritability, malaise/fatigue and weight loss.  HENT: Negative for ear discharge, ear pain, hoarse voice and sore throat.   Eyes: Positive for blurred vision. Negative for double vision, photophobia, pain, discharge and redness.  Respiratory: Negative for cough, sputum production, choking, shortness of breath and wheezing.   Cardiovascular: Negative for chest pain, palpitations and leg swelling.  Gastrointestinal: Positive for heartburn. Negative for abdominal pain, blood in stool, constipation, diarrhea, dysphagia, melena and nausea.  Genitourinary: Negative for dysuria, frequency, hematuria, impotence and urgency.  Musculoskeletal: Negative for back pain, joint pain, myalgias, muscle weakness and neck pain.  Skin: Negative for rash.  Neurological: Negative for dizziness, tingling, sensory change, focal  weakness, weakness and headaches.  Endo/Heme/Allergies: Negative for environmental allergies and polydipsia. Does not bruise/bleed easily.  Psychiatric/Behavioral: Negative for confusion, decreased concentration, depression and suicidal ideas. The patient is nervous/anxious. The patient does not  have insomnia.      Objective  Vitals:   03/18/16 0822  BP: 120/80  Pulse: 80  Weight: 222 lb (100.7 kg)  Height: 5\' 9"  (1.753 m)    Physical Exam  Constitutional: She is oriented to person, place, and time and well-developed, well-nourished, and in no distress. No distress.  HENT:  Head: Normocephalic and atraumatic.  Right Ear: Tympanic membrane, external ear and ear canal normal.  Left Ear: Tympanic membrane, external ear and ear canal normal.  Nose: Nose normal. No mucosal edema.  Mouth/Throat: Uvula is midline, oropharynx is clear and moist and mucous membranes are normal.  Eyes: Conjunctivae, EOM and lids are normal. Pupils are equal, round, and reactive to light. Right eye exhibits no discharge. Left eye exhibits no discharge.  Fundoscopic exam:      The right eye shows no arteriolar narrowing, no AV nicking and no papilledema.       The left eye shows no arteriolar narrowing, no AV nicking and no papilledema.  Neck: Trachea normal and normal range of motion. Neck supple. Normal carotid pulses, no hepatojugular reflux and no JVD present. No spinous process tenderness present. Carotid bruit is not present. No edema and no erythema present. No thyroid mass and no thyromegaly present.  Cardiovascular: Normal rate, regular rhythm, S1 normal, S2 normal, normal heart sounds and intact distal pulses.  Exam reveals no gallop, no S3, no S4, no friction rub and no decreased pulses.   No murmur heard. Pulmonary/Chest: Effort normal and breath sounds normal. No accessory muscle usage. No respiratory distress. Right breast exhibits no inverted nipple, no mass, no nipple discharge, no skin change and no tenderness. Left breast exhibits no inverted nipple, no mass, no nipple discharge, no skin change and no tenderness. Breasts are symmetrical.  Abdominal: Soft. Normal aorta and bowel sounds are normal. She exhibits no abdominal bruit and no mass. There is no hepatosplenomegaly. There is no  tenderness. There is no guarding and no CVA tenderness.  Musculoskeletal: Normal range of motion. She exhibits no edema.  Lymphadenopathy:       Head (right side): No submental adenopathy present.       Head (left side): No submental and no submandibular adenopathy present.    She has no cervical adenopathy.    She has no axillary adenopathy.  Neurological: She is alert and oriented to person, place, and time. She has normal motor skills, normal sensation, normal strength, normal reflexes and intact cranial nerves.  Skin: Skin is warm, dry and intact. She is not diaphoretic.  Small tags noted  Psychiatric: Mood and affect normal.      Assessment & Plan  Problem List Items Addressed This Visit      Digestive   Esophageal reflux   Relevant Medications   ALPRAZolam (XANAX) 0.25 MG tablet   ranitidine (ZANTAC) 150 MG capsule     Other   Episodic paroxysmal anxiety disorder   Relevant Medications   ALPRAZolam (XANAX) 0.25 MG tablet   ranitidine (ZANTAC) 150 MG capsule    Other Visit Diagnoses    Annual physical exam    -  Primary   Breast cancer screening       Relevant Orders   MM Digital Screening   Family history of breast cancer in first  degree relative       Relevant Orders   MM Digital Screening        Dr. Elizabeth Sauereanna Jones [facility]  03/18/16

## 2016-03-22 DIAGNOSIS — Z1231 Encounter for screening mammogram for malignant neoplasm of breast: Secondary | ICD-10-CM | POA: Diagnosis not present

## 2016-04-25 DIAGNOSIS — Z131 Encounter for screening for diabetes mellitus: Secondary | ICD-10-CM | POA: Diagnosis not present

## 2016-04-25 DIAGNOSIS — Z1321 Encounter for screening for nutritional disorder: Secondary | ICD-10-CM | POA: Diagnosis not present

## 2016-04-25 DIAGNOSIS — E049 Nontoxic goiter, unspecified: Secondary | ICD-10-CM | POA: Diagnosis not present

## 2016-04-25 DIAGNOSIS — N920 Excessive and frequent menstruation with regular cycle: Secondary | ICD-10-CM | POA: Diagnosis not present

## 2016-04-25 DIAGNOSIS — Z136 Encounter for screening for cardiovascular disorders: Secondary | ICD-10-CM | POA: Diagnosis not present

## 2016-05-15 DIAGNOSIS — Z30430 Encounter for insertion of intrauterine contraceptive device: Secondary | ICD-10-CM | POA: Diagnosis not present

## 2016-05-23 DIAGNOSIS — N920 Excessive and frequent menstruation with regular cycle: Secondary | ICD-10-CM | POA: Diagnosis not present

## 2016-05-27 DIAGNOSIS — N920 Excessive and frequent menstruation with regular cycle: Secondary | ICD-10-CM | POA: Diagnosis not present

## 2016-06-26 DIAGNOSIS — Z803 Family history of malignant neoplasm of breast: Secondary | ICD-10-CM | POA: Diagnosis not present

## 2016-06-26 DIAGNOSIS — N921 Excessive and frequent menstruation with irregular cycle: Secondary | ICD-10-CM | POA: Diagnosis not present

## 2016-08-11 ENCOUNTER — Ambulatory Visit
Admission: EM | Admit: 2016-08-11 | Discharge: 2016-08-11 | Disposition: A | Discharge status: Home / Self Care | Payer: BLUE CROSS/BLUE SHIELD | Attending: Family Medicine | Admitting: Family Medicine

## 2016-08-11 DIAGNOSIS — Z20828 Contact with and (suspected) exposure to other viral communicable diseases: Secondary | ICD-10-CM

## 2016-08-11 MED ORDER — OSELTAMIVIR PHOSPHATE 75 MG PO CAPS: 75.0000 mg | ORAL_CAPSULE | Freq: Every day | ORAL | 0 refills | Status: AC

## 2016-08-11 NOTE — Discharge Instructions (Signed)
Take medication as prescribed. Drink plenty of fluids.  ° °Follow up with your primary care physician this week as needed. Return to Urgent care for new or worsening concerns.  ° °

## 2016-08-11 NOTE — ED Triage Notes (Signed)
Patient is here since her daughter has the flu to obtain an Rx for prophylaxis tamiflu.

## 2016-08-11 NOTE — ED Provider Notes (Signed)
[facility] ____________________________________________  Time seen: Approximately 10:11 AM  I have reviewed the triage vital signs and the nursing notes.   HISTORY  Chief Complaint Follow-up   HPI Melvine Julin is a 46 y.o. female  presenting for the request of prophylactic Tamiflu dose. Patient reports daughter diagnosed with influenza today, and request prophylactic dose. Patient denies any complaints. Patient reports that she feels well. Denies renal insufficiency. Denies cardiac history.  Denies chest pain, shortness of breath, abdominal pain, dysuria, extremity pain, extremity swelling or rash. Denies recent sickness. Denies recent antibiotic use.   LMP: Currently. Denies pregnancy  Past Medical History:  Diagnosis Date  . GERD (gastroesophageal reflux disease)     Patient Active Problem List   Diagnosis Date Noted  . Episodic paroxysmal anxiety disorder 03/18/2016  . Esophageal reflux 03/18/2016    Past Surgical History:  Procedure Laterality Date  . CESAREAN SECTION       No current facility-administered medications for this encounter.   Current Outpatient Prescriptions:  .  ALPRAZolam (XANAX) 0.25 MG tablet, Take 1 tablet (0.25 mg total) by mouth daily as needed for anxiety., Disp: 30 tablet, Rfl: 0 .  ranitidine (ZANTAC) 150 MG capsule, Take 1 capsule (150 mg total) by mouth once a week. As needed, Disp: 30 capsule, Rfl: 5 .  hydrocortisone (ANUSOL-HC) 2.5 % rectal cream, Place 1 application rectally 2 (two) times daily. (Patient not taking: Reported on 03/18/2016), Disp: 30 g, Rfl: 0 .  oseltamivir (TAMIFLU) 75 MG capsule, Take 1 capsule (75 mg total) by mouth daily., Disp: 10 capsule, Rfl: 0  Allergies Patient has no known allergies.  Family History  Problem Relation Age of Onset  . Cancer Mother   . Diabetes Father   . Cancer Maternal Grandmother   . Cancer Maternal Grandfather   . Cancer Paternal Grandmother   . Diabetes  Paternal Grandmother   . Cancer Paternal Grandfather     Social History Social History  Substance Use Topics  . Smoking status: Former Games developer  . Smokeless tobacco: Never Used  . Alcohol use 0.0 oz/week    Review of Systems Constitutional: No fever/chills ENT: No sore throat. Cardiovascular: Denies chest pain. Respiratory: Denies shortness of breath. Gastrointestinal: No abdominal pain.  No nausea, no vomiting.  No diarrhea.  Genitourinary: Negative for dysuria. Musculoskeletal: Negative for back pain.  10-point ROS otherwise negative.  ____________________________________________   PHYSICAL EXAM:  VITAL SIGNS: ED Triage Vitals  Enc Vitals Group     BP 08/11/16 0913 (!) 139/98     Pulse Rate 08/11/16 0913 75     Resp 08/11/16 0913 16     Temp 08/11/16 0913 98.6 F (37 C)     Temp Source 08/11/16 0913 Oral     SpO2 08/11/16 0913 100 %     Weight 08/11/16 0911 210 lb (95.3 kg)     Height 08/11/16 0911 5\' 10"  (1.778 m)     Head Circumference --      Peak Flow --      Pain Score 08/11/16 0912 0     Pain Loc --      Pain Edu? --      Excl. in GC? --     Constitutional: Alert and oriented. Well appearing and in no acute distress. Eyes: Conjunctivae are normal. PERRL. EOMI. ENT      Head: Normocephalic and atraumatic.      Nose: No congestion/rhinnorhea.      Mouth/Throat: Mucous membranes are moist.Oropharynx  non-erythematous. Cardiovascular: Normal rate, regular rhythm. Grossly normal heart sounds.  Good peripheral circulation. Respiratory: Normal respiratory effort without tachypnea nor retractions. Breath sounds are clear and equal bilaterally. No wheezes, rales, rhonchi. Musculoskeletal:  Ambulates with a steady gait. Neurologic:  Normal speech and language.  Speech is normal. No gait instability.  Psychiatric: Mood and affect are normal. Speech and behavior are normal. Patient exhibits appropriate insight and judgment     ___________________________________________   LABS (all labs ordered are listed, but only abnormal results are displayed)  Labs Reviewed - No data to display   PROCEDURES Procedures    INITIAL IMPRESSION / ASSESSMENT AND PLAN / ED COURSE  Pertinent labs & imaging results that were available during my care of the patient were reviewed by me and considered in my medical decision making (see chart for details).  Well appearing patient in no acute distress. Denies complaints. Patient daughter diagnosed influenza today, requests prophylactic dose of Tamiflu, Rx given. Encouraged supportive care.Discussed indication, risks and benefits of medications with patient.  Discussed follow up with Primary care physician this week as needed.  ____________________________________________   FINAL CLINICAL IMPRESSION(S) / ED DIAGNOSES  Final diagnoses:  Exposure to influenza     Discharge Medication List as of 08/11/2016  9:18 AM    START taking these medications   Details  oseltamivir (TAMIFLU) 75 MG capsule Take 1 capsule (75 mg total) by mouth daily., Starting Sun 08/11/2016, Until Wed 08/21/2016, Normal        Note: This dictation was prepared with Dragon dictation along with smaller phrase technology. Any transcriptional errors that result from this process are unintentional.         Renford DillsLindsey Farron Lafond, NP 08/11/16 1016

## 2016-10-04 DIAGNOSIS — N8301 Follicular cyst of right ovary: Secondary | ICD-10-CM | POA: Diagnosis not present

## 2016-10-04 DIAGNOSIS — N83202 Unspecified ovarian cyst, left side: Secondary | ICD-10-CM | POA: Diagnosis not present

## 2016-10-04 DIAGNOSIS — N921 Excessive and frequent menstruation with irregular cycle: Secondary | ICD-10-CM | POA: Diagnosis not present

## 2016-10-04 DIAGNOSIS — N72 Inflammatory disease of cervix uteri: Secondary | ICD-10-CM | POA: Diagnosis not present

## 2016-10-04 DIAGNOSIS — N83201 Unspecified ovarian cyst, right side: Secondary | ICD-10-CM | POA: Diagnosis not present

## 2016-10-04 DIAGNOSIS — Z87891 Personal history of nicotine dependence: Secondary | ICD-10-CM | POA: Diagnosis not present

## 2016-10-04 DIAGNOSIS — F419 Anxiety disorder, unspecified: Secondary | ICD-10-CM | POA: Diagnosis not present

## 2016-10-04 DIAGNOSIS — N838 Other noninflammatory disorders of ovary, fallopian tube and broad ligament: Secondary | ICD-10-CM | POA: Diagnosis not present

## 2016-10-04 DIAGNOSIS — N8302 Follicular cyst of left ovary: Secondary | ICD-10-CM | POA: Diagnosis not present

## 2016-10-04 DIAGNOSIS — N800 Endometriosis of uterus: Secondary | ICD-10-CM | POA: Diagnosis not present

## 2016-10-04 DIAGNOSIS — N711 Chronic inflammatory disease of uterus: Secondary | ICD-10-CM | POA: Diagnosis not present

## 2016-10-04 DIAGNOSIS — D251 Intramural leiomyoma of uterus: Secondary | ICD-10-CM | POA: Diagnosis not present

## 2016-10-04 DIAGNOSIS — E0500 Thyrotoxicosis with diffuse goiter without thyrotoxic crisis or storm: Secondary | ICD-10-CM | POA: Diagnosis not present

## 2016-10-04 DIAGNOSIS — E785 Hyperlipidemia, unspecified: Secondary | ICD-10-CM | POA: Diagnosis not present

## 2016-10-21 DIAGNOSIS — Y762 Prosthetic and other implants, materials and accessory obstetric and gynecological devices associated with adverse incidents: Secondary | ICD-10-CM | POA: Diagnosis not present

## 2016-10-21 DIAGNOSIS — T8332XA Displacement of intrauterine contraceptive device, initial encounter: Secondary | ICD-10-CM | POA: Diagnosis not present

## 2016-10-31 DIAGNOSIS — Z1231 Encounter for screening mammogram for malignant neoplasm of breast: Secondary | ICD-10-CM | POA: Diagnosis not present

## 2016-10-31 DIAGNOSIS — R922 Inconclusive mammogram: Secondary | ICD-10-CM | POA: Diagnosis not present

## 2016-10-31 DIAGNOSIS — Z803 Family history of malignant neoplasm of breast: Secondary | ICD-10-CM | POA: Diagnosis not present

## 2016-12-11 DIAGNOSIS — Z803 Family history of malignant neoplasm of breast: Secondary | ICD-10-CM | POA: Diagnosis not present

## 2016-12-11 DIAGNOSIS — Z1231 Encounter for screening mammogram for malignant neoplasm of breast: Secondary | ICD-10-CM | POA: Diagnosis not present

## 2016-12-11 DIAGNOSIS — R922 Inconclusive mammogram: Secondary | ICD-10-CM | POA: Diagnosis not present

## 2017-05-29 DIAGNOSIS — R922 Inconclusive mammogram: Secondary | ICD-10-CM | POA: Diagnosis not present

## 2017-05-29 DIAGNOSIS — Z9189 Other specified personal risk factors, not elsewhere classified: Secondary | ICD-10-CM | POA: Diagnosis not present

## 2017-05-29 DIAGNOSIS — Z1231 Encounter for screening mammogram for malignant neoplasm of breast: Secondary | ICD-10-CM | POA: Diagnosis not present

## 2017-05-29 DIAGNOSIS — Z803 Family history of malignant neoplasm of breast: Secondary | ICD-10-CM | POA: Diagnosis not present

## 2017-07-11 DIAGNOSIS — R05 Cough: Secondary | ICD-10-CM | POA: Diagnosis not present

## 2017-07-11 DIAGNOSIS — J069 Acute upper respiratory infection, unspecified: Secondary | ICD-10-CM | POA: Diagnosis not present

## 2017-08-01 ENCOUNTER — Encounter: Payer: Self-pay | Admitting: Family Medicine

## 2017-08-01 ENCOUNTER — Ambulatory Visit (INDEPENDENT_AMBULATORY_CARE_PROVIDER_SITE_OTHER): Payer: BLUE CROSS/BLUE SHIELD | Admitting: Family Medicine

## 2017-08-01 VITALS — BP 110/80 | HR 100 | Temp 99.2°F | Ht 70.0 in | Wt 230.0 lb

## 2017-08-01 DIAGNOSIS — J0100 Acute maxillary sinusitis, unspecified: Secondary | ICD-10-CM

## 2017-08-01 DIAGNOSIS — R059 Cough, unspecified: Secondary | ICD-10-CM

## 2017-08-01 DIAGNOSIS — R05 Cough: Secondary | ICD-10-CM

## 2017-08-01 DIAGNOSIS — R509 Fever, unspecified: Secondary | ICD-10-CM | POA: Diagnosis not present

## 2017-08-01 LAB — POCT INFLUENZA A/B
INFLUENZA A, POC: NEGATIVE
Influenza B, POC: NEGATIVE

## 2017-08-01 MED ORDER — AMOXICILLIN 500 MG PO CAPS: 500.0000 mg | ORAL_CAPSULE | Freq: Three times a day (TID) | ORAL | 0 refills | Status: DC

## 2017-08-01 MED ORDER — GUAIFENESIN-CODEINE 100-10 MG/5ML PO SYRP: 5.0000 mL | ORAL_SOLUTION | Freq: Three times a day (TID) | ORAL | 0 refills | Status: DC | PRN

## 2017-08-01 NOTE — Progress Notes (Addendum)
Name: Colleen Bartlett   MRN: [id number]    DOB: [birth year]   Date:08/01/2017       Progress Note  Subjective  Chief Complaint  Chief Complaint  Patient presents with  . Follow-up    urgent care- was Dx with acute URI on 07/11/17 by [facility] urgent [facility]. Was given Tessalon perles and Atrovent nasal spray, but no antibiotic. Still having teeth pain, chills, headache, fever    Sinusitis  This is a new problem. The current episode started in the past 7 days. The problem has been gradually worsening since onset. The maximum temperature recorded prior to her arrival was 100.4 - 100.9 F. The pain is moderate. Associated symptoms include chills, congestion, ear pain, headaches, a hoarse voice, neck pain, sinus pressure, sneezing, a sore throat and swollen glands. Pertinent negatives include no coughing, diaphoresis or shortness of breath. (Maxillary facial pain/teeth/prod yellow/green nasal discharge) Past treatments include oral decongestants and saline sprays. The treatment provided mild relief.    No problem-specific Assessment & Plan notes found for this encounter.   Past Medical History:  Diagnosis Date  . GERD (gastroesophageal reflux disease)     Past Surgical History:  Procedure Laterality Date  . ABDOMINAL HYSTERECTOMY    . CESAREAN SECTION      Family History  Problem Relation Age of Onset  . Cancer Mother   . Diabetes Father   . Cancer Maternal Grandmother   . Cancer Maternal Grandfather   . Cancer Paternal Grandmother   . Diabetes Paternal Grandmother   . Cancer Paternal Grandfather     Social History   Socioeconomic History  . Marital status: Married    Spouse name: Not on file  . Number of children: Not on file  . Years of education: Not on file  . Highest education level: Not on file  Social Needs  . Financial resource strain: Not on file  . Food insecurity - worry: Not on file  . Food insecurity - inability: Not on file  . Transportation needs - medical: Not  on file  . Transportation needs - non-medical: Not on file  Occupational History  . Not on file  Tobacco Use  . Smoking status: Former Games developer  . Smokeless tobacco: Never Used  Substance and Sexual Activity  . Alcohol use: Yes    Alcohol/week: 0.0 oz  . Drug use: No  . Sexual activity: Yes  Other Topics Concern  . Not on file  Social History Narrative  . Not on file    No Known Allergies  Outpatient Medications Prior to Visit  Medication Sig Dispense Refill  . ALPRAZolam (XANAX) 0.25 MG tablet Take 1 tablet (0.25 mg total) by mouth daily as needed for anxiety. 30 tablet 0  . hydrocortisone (ANUSOL-HC) 2.5 % rectal cream Place 1 application rectally 2 (two) times daily. (Patient not taking: Reported on 03/18/2016) 30 g 0  . ranitidine (ZANTAC) 150 MG capsule Take 1 capsule (150 mg total) by mouth once a week. As needed 30 capsule 5   No facility-administered medications prior to visit.     Review of Systems  Constitutional: Positive for chills. Negative for diaphoresis, fever, malaise/fatigue and weight loss.  HENT: Positive for congestion, ear pain, hoarse voice, sinus pressure, sneezing and sore throat. Negative for ear discharge.   Eyes: Negative for blurred vision.  Respiratory: Negative for cough, sputum production, shortness of breath and wheezing.   Cardiovascular: Negative for chest pain, palpitations and leg swelling.  Gastrointestinal: Negative for abdominal  pain, blood in stool, constipation, diarrhea, heartburn, melena and nausea.  Genitourinary: Negative for dysuria, frequency, hematuria and urgency.  Musculoskeletal: Positive for neck pain. Negative for back pain, joint pain and myalgias.  Skin: Negative for rash.  Neurological: Positive for headaches. Negative for dizziness, tingling, sensory change and focal weakness.  Endo/Heme/Allergies: Negative for environmental allergies and polydipsia. Does not bruise/bleed easily.  Psychiatric/Behavioral: Negative for  depression and suicidal ideas. The patient is not nervous/anxious and does not have insomnia.      Objective  Vitals:   08/01/17 1526  BP: 110/80  Pulse: 100  Temp: 99.2 F (37.3 C)  TempSrc: Oral  Weight: 230 lb (104.3 kg)  Height: 5\' 10"  (1.778 m)    Physical Exam  Constitutional: She is well-developed, well-nourished, and in no distress. No distress.  HENT:  Head: Normocephalic and atraumatic.  Right Ear: Tympanic membrane, external ear and ear canal normal. No decreased hearing is noted.  Left Ear: Tympanic membrane, external ear and ear canal normal.  Nose: Mucosal edema present. Right sinus exhibits maxillary sinus tenderness and frontal sinus tenderness. Left sinus exhibits maxillary sinus tenderness and frontal sinus tenderness.  Mouth/Throat: Posterior oropharyngeal erythema present. No oropharyngeal exudate or posterior oropharyngeal edema.  Eyes: Conjunctivae and EOM are normal. Pupils are equal, round, and reactive to light. Right eye exhibits no discharge. Left eye exhibits no discharge.  Neck: Normal range of motion. Neck supple. No JVD present. No thyromegaly present.  Cardiovascular: Normal rate, regular rhythm, normal heart sounds and intact distal pulses. Exam reveals no gallop and no friction rub.  No murmur heard. Pulmonary/Chest: Effort normal and breath sounds normal. She has no wheezes. She has no rales.  Abdominal: Soft. Bowel sounds are normal. She exhibits no mass. There is no tenderness. There is no guarding.  Musculoskeletal: Normal range of motion. She exhibits no edema.  Lymphadenopathy:    She has no cervical adenopathy.  Neurological: She is alert. She has normal reflexes.  Skin: Skin is warm and dry. She is not diaphoretic.  Psychiatric: Mood and affect normal.  Nursing note and vitals reviewed.     Assessment & Plan  Problem List Items Addressed This Visit    None    Visit Diagnoses    Acute maxillary sinusitis, recurrence not  specified    -  Primary   Relevant Medications   amoxicillin (AMOXIL) 500 MG capsule   guaiFENesin-codeine (ROBITUSSIN AC) 100-10 MG/5ML syrup   Cough       Relevant Medications   guaiFENesin-codeine (ROBITUSSIN AC) 100-10 MG/5ML syrup   Other Relevant Orders   POCT Influenza A/B (Completed)   Fever and chills       Relevant Orders   POCT Influenza A/B (Completed)      Meds ordered this encounter  Medications  . amoxicillin (AMOXIL) 500 MG capsule    Sig: Take 1 capsule (500 mg total) by mouth 3 (three) times daily.    Dispense:  30 capsule    Refill:  0  . guaiFENesin-codeine (ROBITUSSIN AC) 100-10 MG/5ML syrup    Sig: Take 5 mLs by mouth 3 (three) times daily as needed for cough.    Dispense:  100 mL    Refill:  0      Dr. Elizabeth Sauereanna Dwanna Goshert [facility]  08/01/17

## 2017-08-01 NOTE — Addendum Note (Signed)
Addended by: Elizabeth SauerJONES, Iris Tatsch C on: 08/01/2017 04:00 PM   Modules accepted: Orders

## 2017-12-15 DIAGNOSIS — Z803 Family history of malignant neoplasm of breast: Secondary | ICD-10-CM | POA: Diagnosis not present

## 2017-12-15 DIAGNOSIS — Z1231 Encounter for screening mammogram for malignant neoplasm of breast: Secondary | ICD-10-CM | POA: Diagnosis not present

## 2017-12-15 DIAGNOSIS — Z1239 Encounter for other screening for malignant neoplasm of breast: Secondary | ICD-10-CM | POA: Diagnosis not present

## 2017-12-15 DIAGNOSIS — Z9189 Other specified personal risk factors, not elsewhere classified: Secondary | ICD-10-CM | POA: Diagnosis not present

## 2017-12-22 DIAGNOSIS — Z9189 Other specified personal risk factors, not elsewhere classified: Secondary | ICD-10-CM | POA: Diagnosis not present

## 2017-12-22 DIAGNOSIS — Z803 Family history of malignant neoplasm of breast: Secondary | ICD-10-CM | POA: Diagnosis not present

## 2017-12-22 DIAGNOSIS — Z1239 Encounter for other screening for malignant neoplasm of breast: Secondary | ICD-10-CM | POA: Diagnosis not present

## 2017-12-22 DIAGNOSIS — Z1231 Encounter for screening mammogram for malignant neoplasm of breast: Secondary | ICD-10-CM | POA: Diagnosis not present

## 2018-02-26 ENCOUNTER — Encounter: Payer: Self-pay | Admitting: Family Medicine

## 2018-02-26 ENCOUNTER — Ambulatory Visit (INDEPENDENT_AMBULATORY_CARE_PROVIDER_SITE_OTHER): Payer: BLUE CROSS/BLUE SHIELD | Admitting: Family Medicine

## 2018-02-26 VITALS — BP 120/70 | HR 76 | Ht 70.0 in | Wt 232.0 lb

## 2018-02-26 DIAGNOSIS — F419 Anxiety disorder, unspecified: Secondary | ICD-10-CM | POA: Diagnosis not present

## 2018-02-26 DIAGNOSIS — S63635A Sprain of interphalangeal joint of left ring finger, initial encounter: Secondary | ICD-10-CM

## 2018-02-26 DIAGNOSIS — J0100 Acute maxillary sinusitis, unspecified: Secondary | ICD-10-CM

## 2018-02-26 DIAGNOSIS — F329 Major depressive disorder, single episode, unspecified: Secondary | ICD-10-CM

## 2018-02-26 DIAGNOSIS — F32A Depression, unspecified: Secondary | ICD-10-CM

## 2018-02-26 MED ORDER — AMOXICILLIN 500 MG PO CAPS: 500.0000 mg | ORAL_CAPSULE | Freq: Three times a day (TID) | ORAL | 0 refills | Status: DC

## 2018-02-26 MED ORDER — ALPRAZOLAM 0.25 MG PO TABS: 0.2500 mg | ORAL_TABLET | Freq: Two times a day (BID) | ORAL | 2 refills | Status: DC | PRN

## 2018-02-26 MED ORDER — SERTRALINE HCL 50 MG PO TABS: 50.0000 mg | ORAL_TABLET | Freq: Every day | ORAL | 3 refills | Status: DC

## 2018-02-26 NOTE — Progress Notes (Signed)
Name: Colleen Bartlett   MRN: [id number]    DOB: 23-Feb-1971   Date:02/26/2018       Progress Note  Subjective  Chief Complaint  Chief Complaint  Patient presents with  . Sinusitis  . Hand Pain    pulled finger to the side x 1 month ago- still hurts   . Anxiety    stress is bad at work    Sinusitis  This is a new problem. The current episode started in the past 7 days. The problem has been gradually worsening since onset. There has been no fever. The pain is mild. Associated symptoms include congestion, ear pain and sinus pressure. Pertinent negatives include no chills, coughing, diaphoresis, headaches, hoarse voice, neck pain, shortness of breath, sneezing, sore throat or swollen glands. Past treatments include acetaminophen. The treatment provided moderate relief.  Hand Pain   The incident occurred more than 1 week ago. The incident occurred at home. Injury mechanism: caught in door. The quality of the pain is described as aching. The pain is moderate. The pain has been improving since the incident. Pertinent negatives include no chest pain or tingling. She has tried nothing for the symptoms. The treatment provided moderate relief.  Anxiety  Presents for follow-up visit. Symptoms include excessive worry and nervous/anxious behavior. Patient reports no chest pain, compulsions, confusion, decreased concentration, depressed mood, dizziness, dry mouth, feeling of choking, hyperventilation, impotence, insomnia, irritability, malaise, muscle tension, nausea, obsessions, palpitations, panic, restlessness, shortness of breath or suicidal ideas. The severity of symptoms is mild.      No problem-specific Assessment & Plan notes found for this encounter.   Past Medical History:  Diagnosis Date  . GERD (gastroesophageal reflux disease)     Past Surgical History:  Procedure Laterality Date  . ABDOMINAL HYSTERECTOMY    . CESAREAN SECTION      Family History  Problem Relation Age of Onset   . Cancer Mother   . Diabetes Father   . Cancer Maternal Grandmother   . Cancer Maternal Grandfather   . Cancer Paternal Grandmother   . Diabetes Paternal Grandmother   . Cancer Paternal Grandfather     Social History   Socioeconomic History  . Marital status: Married    Spouse name: Not on file  . Number of children: Not on file  . Years of education: Not on file  . Highest education level: Not on file  Occupational History  . Not on file  Social Needs  . Financial resource strain: Not on file  . Food insecurity:    Worry: Not on file    Inability: Not on file  . Transportation needs:    Medical: Not on file    Non-medical: Not on file  Tobacco Use  . Smoking status: Former Games developer  . Smokeless tobacco: Never Used  Substance and Sexual Activity  . Alcohol use: Yes    Alcohol/week: 0.0 standard drinks  . Drug use: No  . Sexual activity: Yes  Lifestyle  . Physical activity:    Days per week: Not on file    Minutes per session: Not on file  . Stress: Not on file  Relationships  . Social connections:    Talks on phone: Not on file    Gets together: Not on file    Attends religious service: Not on file    Active member of club or organization: Not on file    Attends meetings of clubs or organizations: Not on file    Relationship  status: Not on file  . Intimate partner violence:    Fear of current or ex partner: Not on file    Emotionally abused: Not on file    Physically abused: Not on file    Forced sexual activity: Not on file  Other Topics Concern  . Not on file  Social History Narrative  . Not on file    No Known Allergies  Outpatient Medications Prior to Visit  Medication Sig Dispense Refill  . hydrocortisone (ANUSOL-HC) 2.5 % rectal cream Place 1 application rectally 2 (two) times daily. (Patient not taking: Reported on 03/18/2016) 30 g 0  . ALPRAZolam (XANAX) 0.25 MG tablet Take 1 tablet (0.25 mg total) by mouth daily as needed for anxiety. (Patient  not taking: Reported on 02/26/2018) 30 tablet 0  . amoxicillin (AMOXIL) 500 MG capsule Take 1 capsule (500 mg total) by mouth 3 (three) times daily. 30 capsule 0  . guaiFENesin-codeine (ROBITUSSIN AC) 100-10 MG/5ML syrup Take 5 mLs by mouth 3 (three) times daily as needed for cough. 100 mL 0   No facility-administered medications prior to visit.     Review of Systems  Constitutional: Negative for chills, diaphoresis, fever, irritability, malaise/fatigue and weight loss.  HENT: Positive for congestion, ear pain and sinus pressure. Negative for ear discharge, hoarse voice, sneezing and sore throat.   Eyes: Negative for blurred vision.  Respiratory: Negative for cough, sputum production, shortness of breath and wheezing.   Cardiovascular: Negative for chest pain, palpitations and leg swelling.  Gastrointestinal: Negative for abdominal pain, blood in stool, constipation, diarrhea, heartburn, melena and nausea.  Genitourinary: Negative for dysuria, frequency, hematuria, impotence and urgency.  Musculoskeletal: Negative for back pain, joint pain, myalgias and neck pain.  Skin: Negative for rash.  Neurological: Negative for dizziness, tingling, sensory change, focal weakness and headaches.  Endo/Heme/Allergies: Negative for environmental allergies and polydipsia. Does not bruise/bleed easily.  Psychiatric/Behavioral: Negative for confusion, decreased concentration, depression and suicidal ideas. The patient is nervous/anxious. The patient does not have insomnia.      Objective  Vitals:   02/26/18 1545  BP: 120/70  Pulse: 76  Weight: 232 lb (105.2 kg)  Height: 5\' 10"  (1.778 m)    Physical Exam  Constitutional: She is oriented to person, place, and time. She appears well-developed and well-nourished.  HENT:  Head: Normocephalic.  Right Ear: External ear normal.  Left Ear: External ear normal.  Mouth/Throat: Oropharynx is clear and moist.  Eyes: Pupils are equal, round, and reactive to  light. Conjunctivae and EOM are normal. Lids are everted and swept, no foreign bodies found. Left eye exhibits no hordeolum. No foreign body present in the left eye. Right conjunctiva is not injected. Left conjunctiva is not injected. No scleral icterus.  Neck: Normal range of motion. Neck supple. No JVD present. No tracheal deviation present. No thyromegaly present.  Cardiovascular: Normal rate, regular rhythm, normal heart sounds and intact distal pulses. Exam reveals no gallop and no friction rub.  No murmur heard. Pulmonary/Chest: Effort normal and breath sounds normal. No respiratory distress. She has no wheezes. She has no rales.  Abdominal: Soft. Bowel sounds are normal. She exhibits no mass. There is no hepatosplenomegaly. There is no tenderness. There is no rebound and no guarding.  Musculoskeletal: Normal range of motion. She exhibits no edema or tenderness.  Lymphadenopathy:    She has no cervical adenopathy.  Neurological: She is alert and oriented to person, place, and time. She has normal strength. She displays normal reflexes.  No cranial nerve deficit.  Skin: Skin is warm. No rash noted.  Psychiatric: She has a normal mood and affect. Her mood appears not anxious. She does not exhibit a depressed mood.  Nursing note and vitals reviewed.     Assessment & Plan  Problem List Items Addressed This Visit    None    Visit Diagnoses    Anxiety and depression    -  Primary   start setraline 1/2 tab x 10 days then a whole tab daily. Prescribed xanax as needed   Relevant Medications   sertraline (ZOLOFT) 50 MG tablet   ALPRAZolam (XANAX) 0.25 MG tablet   Sprain of interphalangeal joint of left ring finger, initial encounter       buddy tape finger    Acute maxillary sinusitis, recurrence not specified       sent in Amox   Relevant Medications   amoxicillin (AMOXIL) 500 MG capsule      Meds ordered this encounter  Medications  . amoxicillin (AMOXIL) 500 MG capsule    Sig:  Take 1 capsule (500 mg total) by mouth 3 (three) times daily.    Dispense:  30 capsule    Refill:  0  . sertraline (ZOLOFT) 50 MG tablet    Sig: Take 1 tablet (50 mg total) by mouth daily.    Dispense:  30 tablet    Refill:  3  . ALPRAZolam (XANAX) 0.25 MG tablet    Sig: Take 1 tablet (0.25 mg total) by mouth 2 (two) times daily as needed for anxiety.    Dispense:  30 tablet    Refill:  2      Dr. [facility]  02/26/18

## 2018-04-07 ENCOUNTER — Ambulatory Visit (INDEPENDENT_AMBULATORY_CARE_PROVIDER_SITE_OTHER): Payer: BLUE CROSS/BLUE SHIELD

## 2018-04-07 DIAGNOSIS — Z23 Encounter for immunization: Secondary | ICD-10-CM

## 2018-04-14 DIAGNOSIS — D2261 Melanocytic nevi of right upper limb, including shoulder: Secondary | ICD-10-CM | POA: Diagnosis not present

## 2018-04-14 DIAGNOSIS — D2262 Melanocytic nevi of left upper limb, including shoulder: Secondary | ICD-10-CM | POA: Diagnosis not present

## 2018-04-14 DIAGNOSIS — D2271 Melanocytic nevi of right lower limb, including hip: Secondary | ICD-10-CM | POA: Diagnosis not present

## 2018-04-14 DIAGNOSIS — Z1283 Encounter for screening for malignant neoplasm of skin: Secondary | ICD-10-CM | POA: Diagnosis not present

## 2018-06-04 DIAGNOSIS — Z803 Family history of malignant neoplasm of breast: Secondary | ICD-10-CM | POA: Diagnosis not present

## 2018-06-04 DIAGNOSIS — Z1231 Encounter for screening mammogram for malignant neoplasm of breast: Secondary | ICD-10-CM | POA: Diagnosis not present

## 2018-06-04 DIAGNOSIS — Z1239 Encounter for other screening for malignant neoplasm of breast: Secondary | ICD-10-CM | POA: Diagnosis not present

## 2018-06-04 DIAGNOSIS — Z9189 Other specified personal risk factors, not elsewhere classified: Secondary | ICD-10-CM | POA: Diagnosis not present

## 2018-07-06 ENCOUNTER — Other Ambulatory Visit: Payer: Self-pay

## 2018-07-06 DIAGNOSIS — F32A Depression, unspecified: Secondary | ICD-10-CM

## 2018-07-06 DIAGNOSIS — F329 Major depressive disorder, single episode, unspecified: Secondary | ICD-10-CM

## 2018-07-06 DIAGNOSIS — F419 Anxiety disorder, unspecified: Principal | ICD-10-CM

## 2018-07-06 MED ORDER — SERTRALINE HCL 50 MG PO TABS: 50.0000 mg | ORAL_TABLET | Freq: Every day | ORAL | 3 refills | Status: DC

## 2018-08-28 ENCOUNTER — Other Ambulatory Visit: Payer: Self-pay | Admitting: Family Medicine

## 2018-09-01 ENCOUNTER — Other Ambulatory Visit: Payer: Self-pay | Admitting: Family Medicine

## 2018-09-07 ENCOUNTER — Telehealth: Payer: Self-pay

## 2018-09-07 ENCOUNTER — Other Ambulatory Visit: Payer: Self-pay

## 2018-09-07 MED ORDER — ALPRAZOLAM 0.25 MG PO TABS: 0.2500 mg | ORAL_TABLET | Freq: Every evening | ORAL | 0 refills | Status: DC | PRN

## 2018-09-07 NOTE — Telephone Encounter (Signed)
Pt called wanting refill on Alprazolam 0.25

## 2018-09-11 ENCOUNTER — Encounter: Payer: Self-pay | Admitting: Family Medicine

## 2018-09-11 ENCOUNTER — Ambulatory Visit (INDEPENDENT_AMBULATORY_CARE_PROVIDER_SITE_OTHER): Payer: 59 | Admitting: Family Medicine

## 2018-09-11 ENCOUNTER — Other Ambulatory Visit: Payer: Self-pay

## 2018-09-11 VITALS — BP 120/80 | HR 80 | Ht 70.0 in | Wt 235.0 lb

## 2018-09-11 DIAGNOSIS — F329 Major depressive disorder, single episode, unspecified: Secondary | ICD-10-CM | POA: Diagnosis not present

## 2018-09-11 DIAGNOSIS — F419 Anxiety disorder, unspecified: Secondary | ICD-10-CM | POA: Diagnosis not present

## 2018-09-11 DIAGNOSIS — F32A Depression, unspecified: Secondary | ICD-10-CM

## 2018-09-11 MED ORDER — ALPRAZOLAM 0.25 MG PO TABS: 0.2500 mg | ORAL_TABLET | Freq: Every evening | ORAL | 3 refills | Status: DC | PRN

## 2018-09-11 MED ORDER — SERTRALINE HCL 50 MG PO TABS: 50.0000 mg | ORAL_TABLET | Freq: Every day | ORAL | 1 refills | Status: DC

## 2018-09-11 NOTE — Progress Notes (Signed)
Date:  09/11/2018   Name:  Colleen Bartlett   DOB:  [birth year]   MRN:  [id number]   Chief Complaint: Anxiety and Depression (PHQ9=0)  Anxiety  Presents for follow-up visit. Symptoms include excessive worry and nervous/anxious behavior. Patient reports no chest pain, compulsions, confusion, decreased concentration, depressed mood, dizziness, dry mouth, feeling of choking, hyperventilation, impotence, insomnia, irritability, malaise, muscle tension, nausea, obsessions, palpitations, panic, restlessness, shortness of breath or suicidal ideas. Symptoms occur constantly. The severity of symptoms is mild. The quality of sleep is good.    Depression         This is a chronic problem.  The current episode started more than 1 year ago.   The onset quality is gradual. The problem is unchanged.  Associated symptoms include no decreased concentration, no fatigue, no helplessness, no hopelessness, does not have insomnia, not irritable, no restlessness, no decreased interest, no appetite change, no body aches, no myalgias, no headaches, no indigestion, not sad and no suicidal ideas.     The symptoms are aggravated by nothing.  Past treatments include SSRIs - Selective serotonin reuptake inhibitors.  Compliance with treatment is good.  Past medical history includes anxiety.     Review of Systems  Constitutional: Negative.  Negative for appetite change, chills, fatigue, fever, irritability and unexpected weight change.  HENT: Negative for congestion, ear discharge, ear pain, rhinorrhea, sinus pressure, sneezing and sore throat.   Eyes: Negative for photophobia, pain, discharge, redness and itching.  Respiratory: Negative for cough, shortness of breath, wheezing and stridor.   Cardiovascular: Negative for chest pain and palpitations.  Gastrointestinal: Negative for abdominal pain, blood in stool, constipation, diarrhea, nausea and vomiting.  Endocrine: Negative for cold intolerance, heat intolerance,  polydipsia, polyphagia and polyuria.  Genitourinary: Negative for dysuria, flank pain, frequency, hematuria, impotence, menstrual problem, pelvic pain, urgency, vaginal bleeding and vaginal discharge.  Musculoskeletal: Negative for arthralgias, back pain and myalgias.  Skin: Negative for rash.  Allergic/Immunologic: Negative for environmental allergies and food allergies.  Neurological: Negative for dizziness, weakness, light-headedness, numbness and headaches.  Hematological: Negative for adenopathy. Does not bruise/bleed easily.  Psychiatric/Behavioral: Positive for depression. Negative for confusion, decreased concentration, dysphoric mood and suicidal ideas. The patient is nervous/anxious. The patient does not have insomnia.     Patient Active Problem List   Diagnosis Date Noted  . Episodic paroxysmal anxiety disorder 03/18/2016  . Esophageal reflux 03/18/2016    No Known Allergies  Past Surgical History:  Procedure Laterality Date  . ABDOMINAL HYSTERECTOMY    . CESAREAN SECTION      Social History   Tobacco Use  . Smoking status: Former Games developer  . Smokeless tobacco: Never Used  Substance Use Topics  . Alcohol use: Yes    Alcohol/week: 0.0 standard drinks  . Drug use: No     Medication list has been reviewed and updated.  Current Meds  Medication Sig  . ALPRAZolam (XANAX) 0.25 MG tablet Take 1 tablet (0.25 mg total) by mouth at bedtime as needed for anxiety.  . sertraline (ZOLOFT) 50 MG tablet Take 1 tablet (50 mg total) by mouth daily.    PHQ 2/9 Scores 09/11/2018 02/26/2018 08/01/2017 12/12/2015  PHQ - 2 Score 0 1 0 0  PHQ- 9 Score 0 6 0 -    Physical Exam Vitals signs and nursing note reviewed.  Constitutional:      General: She is not irritable.She is not in acute distress.    Appearance: She is not diaphoretic.  HENT:     Head: Normocephalic and atraumatic.     Right Ear: Tympanic membrane, ear canal and external ear normal.     Left Ear: Tympanic membrane,  ear canal and external ear normal.     Nose: Nose normal. No congestion or rhinorrhea.     Mouth/Throat:     Mouth: Mucous membranes are moist.     Pharynx: No oropharyngeal exudate or posterior oropharyngeal erythema.  Eyes:     General:        Right eye: No discharge.        Left eye: No discharge.     Extraocular Movements: Extraocular movements intact.     Conjunctiva/sclera: Conjunctivae normal.     Pupils: Pupils are equal, round, and reactive to light.  Neck:     Musculoskeletal: Normal range of motion and neck supple.     Thyroid: No thyromegaly.     Vascular: No JVD.  Cardiovascular:     Rate and Rhythm: Normal rate and regular rhythm.     Pulses: Normal pulses.     Heart sounds: Normal heart sounds. No murmur. No friction rub. No gallop.   Pulmonary:     Effort: Pulmonary effort is normal. No respiratory distress.     Breath sounds: Normal breath sounds. No stridor. No wheezing, rhonchi or rales.  Chest:     Chest wall: No tenderness.  Abdominal:     General: Bowel sounds are normal.     Palpations: Abdomen is soft. There is no mass.     Tenderness: There is no abdominal tenderness. There is no guarding.  Musculoskeletal: Normal range of motion.  Lymphadenopathy:     Cervical: No cervical adenopathy.  Skin:    General: Skin is warm and dry.     Capillary Refill: Capillary refill takes less than 2 seconds.  Neurological:     Mental Status: She is alert.     Deep Tendon Reflexes: Reflexes are normal and symmetric.     Wt Readings from Last 3 Encounters:  09/11/18 235 lb (106.6 kg)  02/26/18 232 lb (105.2 kg)  08/01/17 230 lb (104.3 kg)    BP 120/80   Pulse 80   Ht 5\' 10"  (1.778 m)   Wt 235 lb (106.6 kg)   BMI 33.72 kg/m   Assessment and Plan: 1. Anxiety and depression Chronic.  Controlled.  Both anxiety and depression are controlled with sertraline 50 mg on a daily basis.  Patient uses alprazolam on only a as needed basis as needed for anxiety and this  is not on an every day basis. - sertraline (ZOLOFT) 50 MG tablet; Take 1 tablet (50 mg total) by mouth daily.  Dispense: 90 tablet; Refill: 1 - ALPRAZolam (XANAX) 0.25 MG tablet; Take 1 tablet (0.25 mg total) by mouth at bedtime as needed for anxiety.  Dispense: 30 tablet; Refill: 3

## 2019-01-19 ENCOUNTER — Other Ambulatory Visit: Payer: Self-pay | Admitting: Family Medicine

## 2019-01-19 DIAGNOSIS — F32A Depression, unspecified: Secondary | ICD-10-CM

## 2019-01-19 DIAGNOSIS — F329 Major depressive disorder, single episode, unspecified: Secondary | ICD-10-CM

## 2019-01-21 ENCOUNTER — Other Ambulatory Visit: Payer: Self-pay | Admitting: Family Medicine

## 2019-01-21 DIAGNOSIS — F419 Anxiety disorder, unspecified: Secondary | ICD-10-CM

## 2019-01-21 DIAGNOSIS — F329 Major depressive disorder, single episode, unspecified: Secondary | ICD-10-CM

## 2019-01-21 DIAGNOSIS — F32A Depression, unspecified: Secondary | ICD-10-CM

## 2019-09-27 ENCOUNTER — Other Ambulatory Visit: Payer: Self-pay

## 2020-01-27 ENCOUNTER — Encounter: Payer: Self-pay | Admitting: Family Medicine

## 2020-01-27 ENCOUNTER — Ambulatory Visit (INDEPENDENT_AMBULATORY_CARE_PROVIDER_SITE_OTHER): Payer: 59 | Admitting: Family Medicine

## 2020-01-27 ENCOUNTER — Other Ambulatory Visit: Payer: Self-pay

## 2020-01-27 VITALS — BP 134/78 | HR 102 | Ht 70.0 in | Wt 227.0 lb

## 2020-01-27 DIAGNOSIS — R635 Abnormal weight gain: Secondary | ICD-10-CM

## 2020-01-27 DIAGNOSIS — F419 Anxiety disorder, unspecified: Secondary | ICD-10-CM

## 2020-01-27 DIAGNOSIS — F329 Major depressive disorder, single episode, unspecified: Secondary | ICD-10-CM

## 2020-01-27 DIAGNOSIS — Z0000 Encounter for general adult medical examination without abnormal findings: Secondary | ICD-10-CM

## 2020-01-27 DIAGNOSIS — F32A Depression, unspecified: Secondary | ICD-10-CM

## 2020-01-27 MED ORDER — ALPRAZOLAM 0.25 MG PO TABS: 0.2500 mg | ORAL_TABLET | Freq: Every evening | ORAL | 5 refills | Status: DC | PRN

## 2020-01-27 NOTE — Progress Notes (Addendum)
Date:  01/27/2020   Name:  Colleen Bartlett   DOB:  [birth year]   MRN:  [id number]   Chief Complaint: Annual Exam (breast exam / no pap-hyst.)  Patient is a 49 year old female who presents for a comprehensive physical exam. The patient reports the following problems: none. Health maintenance has been reviewed mammogram.   Lab Results  Component Value Date   CREATININE 0.62 03/03/2015   BUN 11 03/03/2015   NA 138 03/03/2015   K 4.3 03/03/2015   CL 100 03/03/2015   CO2 23 03/03/2015   Lab Results  Component Value Date   CHOL 200 (H) 03/03/2015   HDL 48 03/03/2015   LDLCALC 117 (H) 03/03/2015   TRIG 175 (H) 03/03/2015   CHOLHDL 4.2 03/03/2015   Lab Results  Component Value Date   TSH 1.870 03/03/2015   No results found for: HGBA1C Lab Results  Component Value Date   WBC 7.2 05/26/2014   HGB 15.0 05/26/2014   HCT 45.5 05/26/2014   MCV 97 05/26/2014   PLT 287 05/26/2014   No results found for: ALT, AST, GGT, ALKPHOS, BILITOT   Review of Systems  Constitutional: Positive for unexpected weight change. Negative for chills, fatigue and fever.  HENT: Negative for congestion, ear discharge, ear pain, hearing loss, mouth sores, rhinorrhea, sinus pressure, sneezing and sore throat.   Eyes: Negative for photophobia, pain, discharge, redness and itching.  Respiratory: Negative for cough, shortness of breath, wheezing and stridor.   Gastrointestinal: Negative for abdominal pain, blood in stool, constipation, diarrhea, nausea and vomiting.  Endocrine: Negative for cold intolerance, heat intolerance, polydipsia, polyphagia and polyuria.  Genitourinary: Negative for dysuria, flank pain, frequency, hematuria, menstrual problem, pelvic pain, urgency, vaginal bleeding and vaginal discharge.  Musculoskeletal: Negative for arthralgias, back pain and myalgias.  Skin: Negative for rash.  Allergic/Immunologic: Negative for environmental allergies and food allergies.  Neurological:  Negative for dizziness, weakness, light-headedness, numbness and headaches.  Hematological: Negative for adenopathy. Does not bruise/bleed easily.  Psychiatric/Behavioral: Negative for dysphoric mood. The patient is not nervous/anxious.     Patient Active Problem List   Diagnosis Date Noted  . Episodic paroxysmal anxiety disorder 03/18/2016  . Esophageal reflux 03/18/2016    No Known Allergies  Past Surgical History:  Procedure Laterality Date  . ABDOMINAL HYSTERECTOMY    . CESAREAN SECTION      Social History   Tobacco Use  . Smoking status: Former Games developer  . Smokeless tobacco: Never Used  Substance Use Topics  . Alcohol use: Yes    Alcohol/week: 0.0 standard drinks  . Drug use: No     Medication list has been reviewed and updated.  Current Meds  Medication Sig  . ALPRAZolam (XANAX) 0.25 MG tablet Take 1 tablet (0.25 mg total) by mouth at bedtime as needed for anxiety.    PHQ 2/9 Scores 01/27/2020 09/11/2018 02/26/2018 08/01/2017  PHQ - 2 Score 0 0 1 0  PHQ- 9 Score 0 0 6 0    GAD 7 : Generalized Anxiety Score 01/27/2020  Nervous, Anxious, on Edge 0  Control/stop worrying 0  Worry too much - different things 0  Trouble relaxing 0  Restless 0  Easily annoyed or irritable 0  Afraid - awful might happen 0  Total GAD 7 Score 0  Anxiety Difficulty Not difficult at all    BP Readings from Last 3 Encounters:  01/27/20 134/78  09/11/18 120/80  02/26/18 120/70    Physical Exam Vitals  and nursing note reviewed. Exam conducted with a chaperone present.  Constitutional:      General: She is not in acute distress.    Appearance: She is well-groomed. She is not diaphoretic.  HENT:     Head: Normocephalic and atraumatic.     Jaw: There is normal jaw occlusion.     Right Ear: Hearing, tympanic membrane, ear canal and external ear normal.     Left Ear: Hearing, tympanic membrane, ear canal and external ear normal.     Nose: Nose normal.     Right Turbinates: Not enlarged  or swollen.     Left Turbinates: Not enlarged or swollen.     Mouth/Throat:     Lips: Pink.     Mouth: Mucous membranes are moist.     Dentition: Normal dentition.     Pharynx: Oropharynx is clear. No posterior oropharyngeal erythema.  Eyes:     General: Lids are normal. Vision grossly intact. Gaze aligned appropriately.        Right eye: No discharge.        Left eye: No discharge.     Extraocular Movements: Extraocular movements intact.     Conjunctiva/sclera: Conjunctivae normal.     Right eye: Right conjunctiva is not injected.     Left eye: Left conjunctiva is not injected.     Pupils: Pupils are equal, round, and reactive to light.     Funduscopic exam:    Right eye: Red reflex present.        Left eye: Red reflex present. Neck:     Thyroid: No thyroid mass, thyromegaly or thyroid tenderness.     Vascular: Normal carotid pulses. No carotid bruit, hepatojugular reflux or JVD.     Trachea: Trachea and phonation normal.  Cardiovascular:     Rate and Rhythm: Normal rate and regular rhythm.     Heart sounds: Normal heart sounds, S1 normal and S2 normal. No murmur heard.  No systolic murmur is present.  No diastolic murmur is present.  No friction rub. No gallop. No S3 or S4 sounds.   Pulmonary:     Effort: Pulmonary effort is normal.     Breath sounds: Normal breath sounds. No decreased breath sounds, wheezing, rhonchi or rales.  Chest:     Chest wall: No mass.     Breasts: Breasts are symmetrical.        Right: Normal. No swelling, bleeding, inverted nipple, mass, nipple discharge, skin change or tenderness.        Left: Normal. No swelling, bleeding, inverted nipple, mass, nipple discharge, skin change or tenderness.  Abdominal:     General: Bowel sounds are normal.     Palpations: Abdomen is soft. There is no hepatomegaly, splenomegaly or mass.     Tenderness: There is no abdominal tenderness. There is no guarding.  Genitourinary:    Rectum: Normal. Guaiac result  negative. No mass.  Musculoskeletal:        General: Normal range of motion.     Cervical back: Normal, full passive range of motion without pain, normal range of motion and neck supple.     Thoracic back: Normal.     Lumbar back: Normal.  Lymphadenopathy:     Head:     Right side of head: No submental adenopathy.     Left side of head: No submental adenopathy.     Cervical: No cervical adenopathy.     Upper Body:     Right upper body: No  supraclavicular or axillary adenopathy.     Left upper body: No supraclavicular or axillary adenopathy.  Skin:    General: Skin is warm and dry.     Capillary Refill: Capillary refill takes less than 2 seconds.     Coloration: Skin is not ashen.  Neurological:     General: No focal deficit present.     Mental Status: She is alert.     Cranial Nerves: Cranial nerves are intact.     Sensory: Sensation is intact.     Motor: Motor function is intact.     Deep Tendon Reflexes: Reflexes are normal and symmetric.  Psychiatric:        Attention and Perception: Attention and perception normal.        Mood and Affect: Mood and affect normal.        Speech: Speech normal.        Behavior: Behavior normal.        Cognition and Memory: Cognition and memory normal.     Wt Readings from Last 3 Encounters:  01/27/20 227 lb (103 kg)  09/11/18 235 lb (106.6 kg)  02/26/18 232 lb (105.2 kg)    BP 134/78   Pulse (!) 102   Ht 5\' 10"  (1.778 m)   Wt 227 lb (103 kg)   SpO2 95%   BMI 32.57 kg/m   Assessment and Plan:  1. Annual physical exam No subjective/objective concerns noted during history and physical exam.  Patient's chart was reviewed for previous encounters as well as most recent lab, most recent imaging, and care everywhere.Jacqueline Spofford is a 49 y.o. female who presents today for her Complete Annual Exam. She feels well. She reports exercising . She reports she is sleeping well. Immunizations are reviewed and recommendations provided.   Age  appropriate screening tests are discussed. Counseling given for risk factor reduction interventions.  Laboratory was discussed and we will obtain a CMP, CBC and lipid panel for baseline physical exam labs. - Comprehensive metabolic panel - CBC with Differential/Platelet - Lipid Panel With LDL/HDL Ratio  2. Weight gain Patient has had some weight gain by her acknowledgment.  We will check a TSH to review possibility of hypothyroidism. - TSH  3. Anxiety and depression Chronic.  Controlled.  Stable.  PHQ is 0 as well as gad score 0.  We will continue alprazolam 0.25 mg at bedtime as needed for anxiety prior to sleep. - ALPRAZolam (XANAX) 0.25 MG tablet; Take 1 tablet (0.25 mg total) by mouth at bedtime as needed for anxiety.  Dispense: 30 tablet; Refill: 5

## 2020-01-28 LAB — CBC WITH DIFFERENTIAL/PLATELET
Basophils Absolute: 0.1 10*3/uL (ref 0.0–0.2)
Basos: 1 %
EOS (ABSOLUTE): 0.2 10*3/uL (ref 0.0–0.4)
Eos: 3 %
Hematocrit: 46.7 % — ABNORMAL HIGH (ref 34.0–46.6)
Hemoglobin: 15.6 g/dL (ref 11.1–15.9)
Immature Grans (Abs): 0 10*3/uL (ref 0.0–0.1)
Immature Granulocytes: 0 %
Lymphocytes Absolute: 1.9 10*3/uL (ref 0.7–3.1)
Lymphs: 37 %
MCH: 31.6 pg (ref 26.6–33.0)
MCHC: 33.4 g/dL (ref 31.5–35.7)
MCV: 95 fL (ref 79–97)
Monocytes Absolute: 0.5 10*3/uL (ref 0.1–0.9)
Monocytes: 9 %
Neutrophils Absolute: 2.4 10*3/uL (ref 1.4–7.0)
Neutrophils: 50 %
Platelets: 326 10*3/uL (ref 150–450)
RBC: 4.93 x10E6/uL (ref 3.77–5.28)
RDW: 13.6 % (ref 11.7–15.4)
WBC: 5 10*3/uL (ref 3.4–10.8)

## 2020-01-28 LAB — COMPREHENSIVE METABOLIC PANEL
ALT: 23 IU/L (ref 0–32)
AST: 18 IU/L (ref 0–40)
Albumin/Globulin Ratio: 1.6 (ref 1.2–2.2)
Albumin: 4.6 g/dL (ref 3.8–4.8)
Alkaline Phosphatase: 101 IU/L (ref 48–121)
BUN/Creatinine Ratio: 17 (ref 9–23)
BUN: 12 mg/dL (ref 6–24)
Bilirubin Total: 0.5 mg/dL (ref 0.0–1.2)
CO2: 22 mmol/L (ref 20–29)
Calcium: 9.6 mg/dL (ref 8.7–10.2)
Chloride: 103 mmol/L (ref 96–106)
Creatinine, Ser: 0.7 mg/dL (ref 0.57–1.00)
GFR calc Af Amer: 118 mL/min/{1.73_m2} (ref >59)
GFR calc non Af Amer: 103 mL/min/{1.73_m2} (ref >59)
Globulin, Total: 2.8 g/dL (ref 1.5–4.5)
Glucose: 85 mg/dL (ref 65–99)
Potassium: 4.8 mmol/L (ref 3.5–5.2)
Sodium: 140 mmol/L (ref 134–144)
Total Protein: 7.4 g/dL (ref 6.0–8.5)

## 2020-01-28 LAB — LIPID PANEL WITH LDL/HDL RATIO
Cholesterol, Total: 271 mg/dL — ABNORMAL HIGH (ref 100–199)
HDL: 51 mg/dL (ref >39)
LDL Chol Calc (NIH): 182 mg/dL — ABNORMAL HIGH (ref 0–99)
LDL/HDL Ratio: 3.6 ratio — ABNORMAL HIGH (ref 0.0–3.2)
Triglycerides: 200 mg/dL — ABNORMAL HIGH (ref 0–149)
VLDL Cholesterol Cal: 38 mg/dL (ref 5–40)

## 2020-01-28 LAB — TSH: TSH: 1.75 u[IU]/mL (ref 0.450–4.500)

## 2020-01-28 NOTE — Patient Instructions (Signed)

## 2020-01-31 ENCOUNTER — Telehealth: Payer: Self-pay | Admitting: Family Medicine

## 2020-01-31 NOTE — Telephone Encounter (Signed)
RX REFILL ALPRAZolam Prudy Feeler) 0.25 MG tablet  PHARMACY [id number] - [place], [place] - [address] Phone:  [phone]  Fax:  [phone]

## 2020-01-31 NOTE — Telephone Encounter (Signed)
Called in.

## 2021-01-29 ENCOUNTER — Ambulatory Visit (INDEPENDENT_AMBULATORY_CARE_PROVIDER_SITE_OTHER): Payer: Managed Care, Other (non HMO) | Admitting: Family Medicine

## 2021-01-29 ENCOUNTER — Other Ambulatory Visit: Payer: Self-pay

## 2021-01-29 ENCOUNTER — Encounter: Payer: Self-pay | Admitting: Family Medicine

## 2021-01-29 VITALS — BP 120/80 | HR 80 | Ht 70.0 in | Wt 215.0 lb

## 2021-01-29 DIAGNOSIS — Z23 Encounter for immunization: Secondary | ICD-10-CM | POA: Diagnosis not present

## 2021-01-29 DIAGNOSIS — S39012A Strain of muscle, fascia and tendon of lower back, initial encounter: Secondary | ICD-10-CM

## 2021-01-29 DIAGNOSIS — E782 Mixed hyperlipidemia: Secondary | ICD-10-CM

## 2021-01-29 DIAGNOSIS — F32A Depression, unspecified: Secondary | ICD-10-CM

## 2021-01-29 DIAGNOSIS — F419 Anxiety disorder, unspecified: Secondary | ICD-10-CM

## 2021-01-29 MED ORDER — CYCLOBENZAPRINE HCL 10 MG PO TABS: 10.0000 mg | ORAL_TABLET | Freq: Three times a day (TID) | ORAL | 0 refills | Status: AC | PRN

## 2021-01-29 MED ORDER — ALPRAZOLAM 0.25 MG PO TABS: 0.2500 mg | ORAL_TABLET | Freq: Every evening | ORAL | 0 refills | Status: AC | PRN

## 2021-01-29 NOTE — Progress Notes (Signed)
Date:  01/29/2021   Name:  Colleen Bartlett   DOB:  February 11, 1971   MRN:  [id number]   Chief Complaint: Hyperlipidemia, Anxiety, and tdap (Needed for update)  Hyperlipidemia This is a chronic problem. The current episode started more than 1 month ago. The problem is uncontrolled. Recent lipid tests were reviewed and are normal. She has no history of chronic renal disease, diabetes, hypothyroidism, liver disease, obesity or nephrotic syndrome. Factors aggravating her hyperlipidemia include fatty foods. Pertinent negatives include no chest pain, leg pain, myalgias or shortness of breath. Current antihyperlipidemic treatment includes diet change and exercise. The current treatment provides moderate improvement of lipids. There are no compliance problems.  Risk factors for coronary artery disease include dyslipidemia.  Anxiety Presents for follow-up visit. Symptoms include nervous/anxious behavior. Patient reports no chest pain, depressed mood, excessive worry, irritability, nausea, panic or shortness of breath. Primary symptoms comment: just when i travel. Symptoms occur rarely. The most recent episode lasted 4 minutes. The severity of symptoms is moderate.     Lab Results  Component Value Date   CREATININE 0.70 01/27/2020   BUN 12 01/27/2020   NA 140 01/27/2020   K 4.8 01/27/2020   CL 103 01/27/2020   CO2 22 01/27/2020   Lab Results  Component Value Date   CHOL 271 (H) 01/27/2020   HDL 51 01/27/2020   LDLCALC 182 (H) 01/27/2020   TRIG 200 (H) 01/27/2020   CHOLHDL 4.2 03/03/2015   Lab Results  Component Value Date   TSH 1.750 01/27/2020   No results found for: HGBA1C Lab Results  Component Value Date   WBC 5.0 01/27/2020   HGB 15.6 01/27/2020   HCT 46.7 (H) 01/27/2020   MCV 95 01/27/2020   PLT 326 01/27/2020   Lab Results  Component Value Date   ALT 23 01/27/2020   AST 18 01/27/2020   ALKPHOS 101 01/27/2020   BILITOT 0.5 01/27/2020     Review of Systems   Constitutional:  Negative for chills, fever and irritability.  HENT:  Negative for drooling, ear discharge, ear pain and sore throat.   Respiratory:  Negative for cough, shortness of breath and wheezing.   Cardiovascular:  Negative for chest pain and leg swelling.  Gastrointestinal:  Negative for abdominal pain, blood in stool, constipation, diarrhea and nausea.  Endocrine: Negative for polydipsia.  Genitourinary:  Negative for dysuria, frequency, hematuria and urgency.  Musculoskeletal:  Positive for back pain. Negative for myalgias and neck pain.  Skin:  Negative for rash.  Allergic/Immunologic: Negative for environmental allergies.  Neurological:  Negative for headaches.  Hematological:  Does not bruise/bleed easily.  Psychiatric/Behavioral:  The patient is nervous/anxious.    Patient Active Problem List   Diagnosis Date Noted   Episodic paroxysmal anxiety disorder 03/18/2016   Esophageal reflux 03/18/2016    No Known Allergies  Past Surgical History:  Procedure Laterality Date   ABDOMINAL HYSTERECTOMY     CESAREAN SECTION      Social History   Tobacco Use   Smoking status: Former   Smokeless tobacco: Never  Substance Use Topics   Alcohol use: Yes    Alcohol/week: 0.0 standard drinks   Drug use: No     Medication list has been reviewed and updated.  Current Meds  Medication Sig   ALPRAZolam (XANAX) 0.25 MG tablet Take 1 tablet (0.25 mg total) by mouth at bedtime as needed for anxiety.    PHQ 2/9 Scores 01/29/2021 01/27/2020 09/11/2018 02/26/2018  PHQ -  2 Score 0 0 0 1  PHQ- 9 Score 0 0 0 6    GAD 7 : Generalized Anxiety Score 01/29/2021 01/27/2020  Nervous, Anxious, on Edge 0 0  Control/stop worrying 0 0  Worry too much - different things 0 0  Trouble relaxing 0 0  Restless 0 0  Easily annoyed or irritable 0 0  Afraid - awful might happen 0 0  Total GAD 7 Score 0 0  Anxiety Difficulty - Not difficult at all    BP Readings from Last 3 Encounters:  01/29/21  120/80  01/27/20 134/78  09/11/18 120/80    Physical Exam Vitals and nursing note reviewed.  HENT:     Right Ear: Tympanic membrane, ear canal and external ear normal. There is no impacted cerumen.     Left Ear: Tympanic membrane, ear canal and external ear normal. There is no impacted cerumen.     Nose: Nose normal.  Eyes:     General:        Right eye: No discharge.        Left eye: No discharge.     Extraocular Movements: Extraocular movements intact.     Pupils: Pupils are equal, round, and reactive to light.  Neck:     Vascular: No carotid bruit.  Cardiovascular:     Heart sounds: No murmur heard.   No gallop.  Pulmonary:     Breath sounds: No stridor. No wheezing, rhonchi or rales.  Abdominal:     Tenderness: There is no guarding or rebound.  Musculoskeletal:     Cervical back: Normal range of motion. No rigidity or tenderness.     Lumbar back: Spasms present.  Lymphadenopathy:     Cervical: No cervical adenopathy.    Wt Readings from Last 3 Encounters:  01/29/21 215 lb (97.5 kg)  01/27/20 227 lb (103 kg)  09/11/18 235 lb (106.6 kg)    BP 120/80   Pulse 80   Ht 5\' 10"  (1.778 m)   Wt 215 lb (97.5 kg)   BMI 30.85 kg/m   Assessment and Plan: 1. Anxiety and depression Episodic.  Mild to moderate.  Stable.  And is only used when travel.  We will refill alprazolam 0.25 as needed as needed. - ALPRAZolam (XANAX) 0.25 MG tablet; Take 1 tablet (0.25 mg total) by mouth at bedtime as needed for anxiety.  Dispense: 30 tablet; Refill: 0  2. Strain of lumbar region, initial encounter New onset.  Stable.  No specific accident or encounter noted however did work out in the yard and has sustained some lower back strain.  We will treat with cyclobenzaprine as needed at night primarily.  - cyclobenzaprine (FLEXERIL) 10 MG tablet; Take 1 tablet (10 mg total) by mouth 3 (three) times daily as needed for muscle spasms.  Dispense: 30 tablet; Refill: 0  3. Moderate mixed  hyperlipidemia not requiring statin therapy Chronic.  Episodic.  Noted on last lipid panel with elevated triglycerides and LDL to a mild range.  Patient has lost some weight but has regained minimal amount.  We will check a current lipid panel and adjust accordingly. - Lipid Panel With LDL/HDL Ratio

## 2021-01-29 NOTE — Addendum Note (Signed)
Addended by: Everitt Amber on: 01/29/2021 09:34 AM   Modules accepted: Orders

## 2021-01-30 ENCOUNTER — Other Ambulatory Visit: Payer: Self-pay

## 2021-01-30 DIAGNOSIS — E782 Mixed hyperlipidemia: Secondary | ICD-10-CM

## 2021-01-30 LAB — LIPID PANEL WITH LDL/HDL RATIO
Cholesterol, Total: 271 mg/dL — ABNORMAL HIGH (ref 100–199)
HDL: 59 mg/dL (ref >39)
LDL Chol Calc (NIH): 179 mg/dL — ABNORMAL HIGH (ref 0–99)
LDL/HDL Ratio: 3 ratio (ref 0.0–3.2)
Triglycerides: 179 mg/dL — ABNORMAL HIGH (ref 0–149)
VLDL Cholesterol Cal: 33 mg/dL (ref 5–40)

## 2021-01-30 MED ORDER — ATORVASTATIN CALCIUM 10 MG PO TABS: 10.0000 mg | ORAL_TABLET | Freq: Every day | ORAL | 1 refills | Status: AC

## 2021-03-08 ENCOUNTER — Telehealth: Payer: Self-pay

## 2021-03-08 NOTE — Telephone Encounter (Signed)
Copied from CRM [id number]. Topic: General - Other >> Mar 08, 2021  4:17 PM Laural Benes, [place] C wrote: Reason for CRM: pt called in to request a call back from [place]. Pt declined giving info or further assistance from myself.   Pt's cb: [phone]

## 2021-09-12 ENCOUNTER — Telehealth: Payer: Self-pay

## 2021-09-12 NOTE — Telephone Encounter (Signed)
Copied from [place] [id number]. Topic: General - Other ?>> Sep 12, 2021  2:35 PM Bayard Beaver wrote: ?Reason for ME:[id number], nurse from [facility] , was told by pt that they're last visit was for labs, after August also, but I'm only showing , most recent in August and pending for September, cb# [phone] ?

## 2022-02-01 ENCOUNTER — Encounter: Payer: Self-pay | Admitting: Family Medicine

## 2022-03-18 ENCOUNTER — Telehealth: Payer: Self-pay | Admitting: Family Medicine

## 2022-03-18 NOTE — Telephone Encounter (Signed)
Copied from [place] [id number]. Topic: Appointment Scheduling - Scheduling Inquiry for Clinic >> Mar 18, 2022  9:18 AM Cyndi Bender wrote: Reason for CRM: Pt stated she wants to make sure that the appt will be done as a physical because last year the appt was changed and she was charged $250 for the appt and nothing was done. Pt requests call back to confirm that the 03/21/22 appt will be done as physical. Cb# [phone]

## 2022-03-21 ENCOUNTER — Encounter: Payer: Self-pay | Admitting: Family Medicine
